# Patient Record
Sex: Female | Born: 1990 | Race: Black or African American | Hispanic: No | Smoking: Former smoker
Health system: Southern US, Community
[De-identification: ages and names within clinical notes are randomized; demographics above are authoritative.]

## PROBLEM LIST (undated history)

## (undated) DIAGNOSIS — R309 Painful micturition, unspecified: Secondary | ICD-10-CM

## (undated) DIAGNOSIS — N921 Excessive and frequent menstruation with irregular cycle: Secondary | ICD-10-CM

## (undated) DIAGNOSIS — D573 Sickle-cell trait: Secondary | ICD-10-CM

## (undated) DIAGNOSIS — R87810 Cervical high risk human papillomavirus (HPV) DNA test positive: Secondary | ICD-10-CM

## (undated) DIAGNOSIS — Z8619 Personal history of other infectious and parasitic diseases: Principal | ICD-10-CM

## (undated) DIAGNOSIS — R0781 Pleurodynia: Secondary | ICD-10-CM

## (undated) DIAGNOSIS — R109 Unspecified abdominal pain: Secondary | ICD-10-CM

## (undated) DIAGNOSIS — Z309 Encounter for contraceptive management, unspecified: Secondary | ICD-10-CM

## (undated) DIAGNOSIS — J45909 Unspecified asthma, uncomplicated: Secondary | ICD-10-CM

## (undated) DIAGNOSIS — Z789 Other specified health status: Secondary | ICD-10-CM

## (undated) DIAGNOSIS — R350 Frequency of micturition: Secondary | ICD-10-CM

## (undated) DIAGNOSIS — A749 Chlamydial infection, unspecified: Secondary | ICD-10-CM

## (undated) DIAGNOSIS — R05 Cough: Secondary | ICD-10-CM

## (undated) HISTORY — DX: Encounter for contraceptive management, unspecified: Z30.9

## (undated) HISTORY — DX: Pleurodynia: R07.81

## (undated) HISTORY — DX: Chlamydial infection, unspecified: A74.9

## (undated) HISTORY — DX: Cervical high risk human papillomavirus (HPV) DNA test positive: R87.810

## (undated) HISTORY — DX: Sickle-cell trait: D57.3

## (undated) HISTORY — DX: Painful micturition, unspecified: R30.9

## (undated) HISTORY — DX: Personal history of other infectious and parasitic diseases: Z86.19

## (undated) HISTORY — DX: Frequency of micturition: R35.0

## (undated) HISTORY — DX: Excessive and frequent menstruation with irregular cycle: N92.1

## (undated) HISTORY — DX: Unspecified abdominal pain: R10.9

## (undated) HISTORY — DX: Cough: R05

## (undated) HISTORY — PX: NO PAST SURGERIES: SHX2092

---
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
## 2012-04-12 ENCOUNTER — Encounter: Payer: Self-pay | Admitting: *Deleted

## 2012-04-12 DIAGNOSIS — D573 Sickle-cell trait: Secondary | ICD-10-CM | POA: Insufficient documentation

## 2012-04-13 ENCOUNTER — Encounter: Payer: Self-pay | Admitting: Adult Health

## 2012-04-13 ENCOUNTER — Ambulatory Visit (INDEPENDENT_AMBULATORY_CARE_PROVIDER_SITE_OTHER): Payer: Medicaid Other | Admitting: Adult Health

## 2012-04-13 VITALS — BP 100/80 | Ht 63.0 in | Wt 105.0 lb

## 2012-04-13 DIAGNOSIS — Z309 Encounter for contraceptive management, unspecified: Secondary | ICD-10-CM

## 2012-04-13 DIAGNOSIS — IMO0001 Reserved for inherently not codable concepts without codable children: Secondary | ICD-10-CM

## 2012-04-13 DIAGNOSIS — Z3202 Encounter for pregnancy test, result negative: Secondary | ICD-10-CM

## 2012-04-13 DIAGNOSIS — Z30017 Encounter for initial prescription of implantable subdermal contraceptive: Secondary | ICD-10-CM

## 2012-04-13 HISTORY — DX: Encounter for contraceptive management, unspecified: Z30.9

## 2012-04-13 MED ORDER — ETONOGESTREL 68 MG ~~LOC~~ IMPL: 68.0000 mg | DRUG_IMPLANT | Freq: Once | SUBCUTANEOUS | Status: DC

## 2012-04-13 NOTE — Progress Notes (Signed)
Subjective:     Patient ID: Deanna Sheppard, female   DOB: Jul 06, 1990, 22 y.o.   MRN: [id number]  HPI Deanna Sheppard is a 22 year old black female in for a nexplanon insertion.LMP 04/03/12, last sex about 1 week before last menses and she was taking lo loestrin through yesterday.   Review of Systems No compalints     Objective:   Physical Exam Blood pressure 100/80, height 5\' 3"  (1.6 m), weight 105 lb (47.628 kg), last menstrual period 04/03/2012, not currently breastfeeding.   Left arm cleansed with betadine, and injected with 1.5 cc 2% lidocaine and waited til numb. Nexplanon easily inserted and steri strips applied. Pressure dressing applied. Exp:06/2014, LOT: [id number]. The rod was easily palpated my pt. and myself. Assessment:      Contraceptive Management Nexplanon insertion    Plan:       Use condoms x 2-4 weeks, keep clean and dry x 24 hours, no heavy lifting, keep steri strips on x 72 hours, Keep pressure dressing on x 24 hours. Follow up prn problems. Pt. Is aware the nexplanon is effective for 3 years, but does not prevent STIs. and that she should use condoms.    Return in 1 year for pap and physical.

## 2012-04-13 NOTE — Patient Instructions (Addendum)
Use condoms x 2-4 weeks, keep clean and dry x 24 hours, no heavy lifting, keep steri strips on x 72 hours, Keep pressure dressing on x 24 hours. Follow up prn problems.  Return to clinic in 1 year for pap and physical. Sign  Up for my chart

## 2012-05-27 ENCOUNTER — Encounter (HOSPITAL_COMMUNITY): Payer: Self-pay | Admitting: *Deleted

## 2012-05-27 ENCOUNTER — Emergency Department (HOSPITAL_COMMUNITY)
Admission: EM | Admit: 2012-05-27 | Discharge: 2012-05-28 | Disposition: A | Discharge status: Home / Self Care | Payer: No Typology Code available for payment source | Attending: Emergency Medicine | Admitting: Emergency Medicine

## 2012-05-27 DIAGNOSIS — IMO0002 Reserved for concepts with insufficient information to code with codable children: Secondary | ICD-10-CM | POA: Insufficient documentation

## 2012-05-27 DIAGNOSIS — S93502A Unspecified sprain of left great toe, initial encounter: Secondary | ICD-10-CM

## 2012-05-27 DIAGNOSIS — S93609A Unspecified sprain of unspecified foot, initial encounter: Secondary | ICD-10-CM | POA: Insufficient documentation

## 2012-05-27 DIAGNOSIS — Y9389 Activity, other specified: Secondary | ICD-10-CM | POA: Insufficient documentation

## 2012-05-27 DIAGNOSIS — Y92410 Unspecified street and highway as the place of occurrence of the external cause: Secondary | ICD-10-CM | POA: Insufficient documentation

## 2012-05-27 DIAGNOSIS — Z862 Personal history of diseases of the blood and blood-forming organs and certain disorders involving the immune mechanism: Secondary | ICD-10-CM | POA: Insufficient documentation
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
## 2012-05-29 ENCOUNTER — Emergency Department (HOSPITAL_COMMUNITY): Payer: No Typology Code available for payment source

## 2012-05-29 ENCOUNTER — Encounter (HOSPITAL_COMMUNITY): Payer: Self-pay

## 2012-05-29 ENCOUNTER — Emergency Department (HOSPITAL_COMMUNITY)
Admission: EM | Admit: 2012-05-29 | Discharge: 2012-05-29 | Disposition: A | Discharge status: Home / Self Care | Payer: No Typology Code available for payment source | Attending: Emergency Medicine | Admitting: Emergency Medicine

## 2012-05-29 DIAGNOSIS — Z862 Personal history of diseases of the blood and blood-forming organs and certain disorders involving the immune mechanism: Secondary | ICD-10-CM | POA: Insufficient documentation

## 2012-05-29 DIAGNOSIS — IMO0002 Reserved for concepts with insufficient information to code with codable children: Secondary | ICD-10-CM | POA: Insufficient documentation

## 2012-05-29 DIAGNOSIS — S6990XA Unspecified injury of unspecified wrist, hand and finger(s), initial encounter: Secondary | ICD-10-CM | POA: Insufficient documentation

## 2012-05-29 DIAGNOSIS — S59909A Unspecified injury of unspecified elbow, initial encounter: Secondary | ICD-10-CM | POA: Insufficient documentation

## 2012-05-29 DIAGNOSIS — S59919A Unspecified injury of unspecified forearm, initial encounter: Secondary | ICD-10-CM | POA: Insufficient documentation

## 2012-05-29 DIAGNOSIS — Z3202 Encounter for pregnancy test, result negative: Secondary | ICD-10-CM | POA: Insufficient documentation

## 2012-05-29 DIAGNOSIS — M545 Low back pain: Secondary | ICD-10-CM

## 2012-05-29 DIAGNOSIS — Y9389 Activity, other specified: Secondary | ICD-10-CM | POA: Insufficient documentation

## 2012-05-29 DIAGNOSIS — S139XXA Sprain of joints and ligaments of unspecified parts of neck, initial encounter: Secondary | ICD-10-CM | POA: Insufficient documentation

## 2012-05-29 DIAGNOSIS — S161XXD Strain of muscle, fascia and tendon at neck level, subsequent encounter: Secondary | ICD-10-CM

## 2012-05-29 DIAGNOSIS — S99919A Unspecified injury of unspecified ankle, initial encounter: Secondary | ICD-10-CM | POA: Insufficient documentation

## 2012-05-29 DIAGNOSIS — S8990XA Unspecified injury of unspecified lower leg, initial encounter: Secondary | ICD-10-CM | POA: Insufficient documentation

## 2012-05-29 DIAGNOSIS — Y92410 Unspecified street and highway as the place of occurrence of the external cause: Secondary | ICD-10-CM | POA: Insufficient documentation

## 2012-05-29 LAB — POCT PREGNANCY, URINE: Preg Test, Ur: NEGATIVE

## 2012-05-29 MED ORDER — TRAMADOL HCL 50 MG PO TABS: 50.0000 mg | ORAL_TABLET | Freq: Four times a day (QID) | ORAL | Status: DC | PRN

## 2012-05-29 MED ORDER — CYCLOBENZAPRINE HCL 10 MG PO TABS: 10.0000 mg | ORAL_TABLET | Freq: Three times a day (TID) | ORAL | Status: DC | PRN
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
## 2012-05-29 NOTE — ED Notes (Signed)
C-collar applied

## 2012-05-30 NOTE — ED Provider Notes (Signed)
Medical screening examination/treatment/procedure(s) were performed by non-physician practitioner and as supervising physician I was immediately available for consultation/collaboration.   Loren Racer, MD 05/30/12 [phone]

## 2012-06-07 ENCOUNTER — Emergency Department (HOSPITAL_COMMUNITY)
Admission: EM | Admit: 2012-06-07 | Discharge: 2012-06-07 | Disposition: A | Discharge status: Home / Self Care | Payer: Medicaid Other | Attending: Emergency Medicine | Admitting: Emergency Medicine

## 2012-06-07 ENCOUNTER — Encounter (HOSPITAL_COMMUNITY): Payer: Self-pay | Admitting: Emergency Medicine

## 2012-06-07 DIAGNOSIS — Z862 Personal history of diseases of the blood and blood-forming organs and certain disorders involving the immune mechanism: Secondary | ICD-10-CM | POA: Insufficient documentation

## 2012-06-07 DIAGNOSIS — T63461A Toxic effect of venom of wasps, accidental (unintentional), initial encounter: Secondary | ICD-10-CM | POA: Insufficient documentation

## 2012-06-07 DIAGNOSIS — Y929 Unspecified place or not applicable: Secondary | ICD-10-CM | POA: Insufficient documentation

## 2012-06-07 DIAGNOSIS — T6391XA Toxic effect of contact with unspecified venomous animal, accidental (unintentional), initial encounter: Secondary | ICD-10-CM | POA: Insufficient documentation

## 2012-06-07 DIAGNOSIS — Z309 Encounter for contraceptive management, unspecified: Secondary | ICD-10-CM | POA: Insufficient documentation

## 2012-06-07 DIAGNOSIS — Y939 Activity, unspecified: Secondary | ICD-10-CM | POA: Insufficient documentation

## 2012-06-07 MED ORDER — DIPHENHYDRAMINE HCL 25 MG PO CAPS: 25.0000 mg | ORAL_CAPSULE | Freq: Four times a day (QID) | ORAL | Status: DC | PRN

## 2012-06-07 MED ORDER — CEPHALEXIN 500 MG PO CAPS: 500.0000 mg | ORAL_CAPSULE | Freq: Four times a day (QID) | ORAL | Status: DC

## 2012-06-07 MED ORDER — CEPHALEXIN 500 MG PO CAPS
500.0000 mg | ORAL_CAPSULE | Freq: Once | ORAL | Status: AC
  Administered 2012-06-07: 500 mg via ORAL
  Filled 2012-06-07: qty 1

## 2012-06-07 MED ORDER — IBUPROFEN 600 MG PO TABS: 600.0000 mg | ORAL_TABLET | Freq: Four times a day (QID) | ORAL | Status: DC | PRN

## 2012-06-07 MED ORDER — DIPHENHYDRAMINE HCL 25 MG PO CAPS
25.0000 mg | ORAL_CAPSULE | Freq: Once | ORAL | Status: AC
  Administered 2012-06-07: 25 mg via ORAL
  Filled 2012-06-07: qty 1

## 2012-06-07 MED ORDER — IBUPROFEN 400 MG PO TABS
400.0000 mg | ORAL_TABLET | Freq: Once | ORAL | Status: AC
  Administered 2012-06-07: 400 mg via ORAL
  Filled 2012-06-07: qty 1

## 2012-06-07 NOTE — ED Notes (Signed)
Pt c/o pain and swelling to bee sting on right 2nd toe.

## 2012-06-08 NOTE — ED Provider Notes (Signed)
History     CSN: [id number]  Arrival date & time 06/07/12  1451   First MD Initiated Contact with Patient 06/07/12 1458      Chief Complaint  Patient presents with  . Insect Bite    (Consider location/radiation/quality/duration/timing/severity/associated sxs/prior treatment) HPI Comments: Deanna Sheppard is a 22 y.o. Female presenting with a yellow jacket sting to her right 2nd toe which occurred yesterday.  She had pain and swelling at the site initially,  But has developed worse pain,swelling and drainage today at the site of the puncture wound. Her grandmother "dug around" in it yesterday looking for a possible retained stinger, which was not found.  Pain is constant, throbbing and does not radiate, is worse with palpation and improved with elevation.  She has taken tylenol without relief.      The history is provided by the patient.    Past Medical History  Diagnosis Date  . No pertinent past medical history   . Sickle cell trait   . Contraception management 04/13/2012    Past Surgical History  Procedure Laterality Date  . No past surgeries      Family History  Problem Relation Age of Onset  . Adopted: Yes  . Hypertension Mother   . Cancer Maternal Grandmother     breast   . Diabetes Maternal Grandmother     History  Substance Use Topics  . Smoking status: Never Smoker   . Smokeless tobacco: Never Used  . Alcohol Use: No    OB History   Grav Para Term Preterm Abortions TAB SAB Ect Mult Living   1 1 1  0 0 0 0 0 0 1      Review of Systems  Constitutional: Negative for fever and chills.  HENT: Negative for facial swelling.   Respiratory: Negative for shortness of breath and wheezing.   Skin: Positive for wound.  Neurological: Negative for numbness.    Allergies  Review of patient's allergies indicates no known allergies.  Home Medications   Current Outpatient Rx  Name  Route  Sig  Dispense  Refill  . acetaminophen (TYLENOL) 500 MG tablet  Oral   Take 500-1,000 mg by mouth every 6 (six) hours as needed for pain.         . cephALEXin (KEFLEX) 500 MG capsule   Oral   Take 1 capsule (500 mg total) by mouth 4 (four) times daily.   28 capsule   0   . cyclobenzaprine (FLEXERIL) 10 MG tablet   Oral   Take 1 tablet (10 mg total) by mouth 3 (three) times daily as needed.   21 tablet   0   . diphenhydrAMINE (BENADRYL) 25 mg capsule   Oral   Take 1 capsule (25 mg total) by mouth every 6 (six) hours as needed for itching.   30 capsule   0   . ibuprofen (ADVIL,MOTRIN) 600 MG tablet   Oral   Take 1 tablet (600 mg total) by mouth every 6 (six) hours as needed for pain.   20 tablet   0   . traMADol (ULTRAM) 50 MG tablet   Oral   Take 1 tablet (50 mg total) by mouth every 6 (six) hours as needed for pain.   20 tablet   0     BP 116/63  Pulse 91  Temp(Src) 98.4 F (36.9 C) (Oral)  Resp 17  Ht 5\' 3"  (1.6 m)  Wt 110 lb (49.896 kg)  BMI 19.49 kg/m2  SpO2 99%  LMP 05/04/2012  Physical Exam  Constitutional: She appears well-developed and well-nourished. No distress.  HENT:  Head: Normocephalic.  Neck: Neck supple.  Cardiovascular: Normal rate.   Pulmonary/Chest: Effort normal. She has no wheezes.  Musculoskeletal: Normal range of motion. She exhibits no edema.  Skin: There is erythema.  Edema with erythema of right 2nd distal toe.  Cap refill less than 3 sec.  No swelling of foot,  There is a small puncture at tip of toe with serous drainage.  No fluctuance.    ED Course  Procedures (including critical care time)  Labs Reviewed - No data to display No results found.   1. Yellow jacket sting, initial encounter       MDM  Yellow jacket sting with probable localized reaction. Benadryl,  Ibuprofen recommended.  Elevation.  Pt placed on keflex given manipulation with tweezers ytd - concern for skin infection. Encouraged warm soaks,  Recheck if sx worsen.        Burgess Amor, PA-C 06/08/12 2128

## 2012-06-10 NOTE — ED Provider Notes (Signed)
Medical screening examination/treatment/procedure(s) were performed by non-physician practitioner and as supervising physician I was immediately available for consultation/collaboration.  Donnetta Hutching, MD 06/10/12 [phone]

## 2012-09-27 ENCOUNTER — Encounter: Payer: Self-pay | Admitting: *Deleted

## 2012-09-27 ENCOUNTER — Ambulatory Visit: Payer: Medicaid Other | Admitting: Adult Health

## 2012-10-17 ENCOUNTER — Encounter: Payer: Self-pay | Admitting: Adult Health

## 2012-10-17 ENCOUNTER — Ambulatory Visit (INDEPENDENT_AMBULATORY_CARE_PROVIDER_SITE_OTHER): Payer: Medicaid Other | Admitting: Adult Health

## 2012-10-17 VITALS — BP 86/50 | Ht 63.0 in | Wt 114.0 lb

## 2012-10-17 DIAGNOSIS — Z3202 Encounter for pregnancy test, result negative: Secondary | ICD-10-CM

## 2012-10-17 DIAGNOSIS — R350 Frequency of micturition: Secondary | ICD-10-CM

## 2012-10-17 HISTORY — DX: Frequency of micturition: R35.0

## 2012-10-17 LAB — POCT URINALYSIS DIPSTICK
Leukocytes, UA: NEGATIVE
Nitrite, UA: NEGATIVE
Protein, UA: NEGATIVE

## 2012-10-17 NOTE — Progress Notes (Signed)
Subjective:     Patient ID: Deanna Sheppard, female   DOB: [birth year], 22 y.o.   MRN: [id number]  HPI Deanna Sheppard is a 22 year old black female in complaining of urinary frequency. No loss of urine,no pain,occasion LLQ cramp and spots some with nexplanon. Last sex 3 weeks ago.She has stopped sodas but drinks a lot of kool aide.No history of gestational diabetes, no increase in thirst or weight loss.  Review of Systems See HPI. Reviewed past medical,surgical, social and family history. Reviewed medications and allergies.     Objective:   Physical Exam BP 86/50  Ht 5\' 3"  (1.6 m)  Wt 114 lb (51.71 kg)  BMI 20.2 kg/m2  LMP 09/18/2012  Breastfeeding? Nourine pregnancy test negative and urine dipstick negative, Skin warm and dry.Pelvic: external genitalia is normal in appearance, vagina: normal in appearance, cervix:smooth and bulbous, uterus: normal size, shape and contour, non tender, no masses felt, adnexa: no masses or tenderness noted.     Assessment:     Urinary frequency    Plan:     Stop kool aide and drink water, if symptoms persist call back Review handout on urinary frequency

## 2012-10-17 NOTE — Patient Instructions (Signed)
Urinary Frequency The number of times a normal person urinates depends upon how much liquid they take in and how much liquid they are losing. If the temperature is hot and there is high humidity then the person will sweat more and usually breathe a little more frequently. These factors decrease the amount of frequency of urination that would be considered normal. The amount you drink is easily determined, but the amount of fluid lost is sometimes more difficult to calculate.  Fluid is lost in two ways:  Sensible fluid loss is usually measured by the amount of urine that you get rid of. Losses of fluid can also occur with diarrhea.  Insensible fluid loss is more difficult to measure. It is caused by evaporation. Insensible loss of fluid occurs through breathing and sweating. It usually ranges from a little less than a quart to a little more than a quart of fluid a day. In normal temperatures and activity levels the average person may urinate 4 to 7 times in a 24-hour period. Needing to urinate more often than that could indicate a problem. If one urinates 4 to 7 times in 24 hours and has large volumes each time, that could indicate a different problem from one who urinates 4 to 7 times a day and has small volumes. The time of urinating is also an important. Most urinating should be done during the waking hours. Getting up at night to urinate frequently can indicate some problems. CAUSES  The bladder is the organ in your lower abdomen that holds urine. Like a balloon, it swells some as it fills up. Your nerves sense this and tell you it is time to head for the bathroom. There are a number of reasons that you might feel the need to urinate more often than usual. They include:  Urinary tract infection. This is usually associated with other signs such as burning when you urinate.  In men, problems with the prostate (a walnut-size gland that is located near the tube that carries urine out of your body).  There are two reasons why the prostate can cause an increased frequency of urination:  An enlarged prostate that does not let the bladder empty well. If the bladder only half empties when you urinate then it only has half the capacity to fill before you have to urinate again.  The nerves in the bladder become more hypersensitive with an increased size of the prostate even if the bladder empties completely.  Pregnancy.  Obesity. Excess weight is more likely to cause a problem for women more than for men.  Bladder stones or other bladder problems.  Caffeine.  Alcohol.  Medications. For example, drugs that help the body get rid of extra fluid (diuretics) increase urine production. Some other medicines must be taken with lots of fluids.  Muscle or nerve weakness. This might be the result of a spinal cord injury, a stroke, multiple sclerosis or Parkinson's disease.  Long-standing diabetes can decrease the sensation of the bladder. This loss of sensation makes it harder to sense the bladder needs to be emptied. Over a period of years the bladder is stretched out by constant overfilling. This weakens the bladder muscles so that the bladder does not empty well and has less capacity to fill with new urine.  Interstitial cystitis (also called painful bladder syndrome). This condition develops because the tissues that line the insider of the bladder are inflamed (inflammation is the body's way of reacting to injury or infection). It causes pain  and frequent urination. It occurs in women more often than in men. DIAGNOSIS   To decide what might be causing your urinary frequency, your healthcare provider will probably:  Ask about symptoms you have noticed.  Ask about your overall health. This will include questions about any medications you are taking.  Do a physical examination.  Order some tests. These might include:  A blood test to check for diabetes or other health issues that could be  contributing to the problem.  Urine testing. This could measure the flow of urine and the pressure on the bladder.  A test of your neurological system (the brain, spinal cord and nerves). This is the system that senses the need to urinate.  A bladder test to check whether it is emptying completely when you urinate.  Cytoscopy. This test uses a thin tube with a tiny camera on it. It offers a look inside your urethra and bladder to see if there are problems.  Imaging tests. You might be given a contrast dye and then asked to urinate. X-rays are taken to see how your bladder is working. TREATMENT  It is important for you to be evaluated to determine if the amount or frequency that you have is unusual or abnormal. If it is found to be abnormal the cause should be determined and this can usually be found out easily. Depending upon the cause treatment could include medication, stimulation of the nerves, or surgery. There are not too many things that you can do as an individual to change your urinary frequency. It is important that you balance the amount of fluid intake needed to compensate for your activity and the temperature. Medical problems will be diagnosed and taken care of by your physician. There is no particular bladder training such as Kegel's exercises that you can do to help urinary frequency. This is an exercise this is usually done for people who have leaking of urine when they laugh cough or sneeze. HOME CARE INSTRUCTIONS   Take any medications your healthcare provider prescribed or suggested. Follow the directions carefully.  Practice any lifestyle changes that are recommended. These might include:  Drinking less fluid or drinking at different times of the day. If you need to urinate often during the night, for example, you may need to stop drinking fluids early in the evening.  Cutting down on caffeine or alcohol. They both can make you need to urinate more often than normal.  Caffeine is found in coffee, tea and sodas.  Losing weight, if that is recommended.  Keep a journal or a log. You might be asked to record how much you drink and when and when you feel the need to urinate. This will also help evaluate how well the treatment provided by your physician is working. SEEK MEDICAL CARE IF:   Your need to urinate often gets worse.  You feel increased pain or irritation when you urinate.  You notice blood in your urine.  You have questions about any medications that your healthcare provider recommended.  You notice blood, pus or swelling at the site of any test or treatment procedure.  You develop a fever of more than 100.5 F (38.1 C). SEEK IMMEDIATE MEDICAL CARE IF:  You develop a fever of more than 102.0 F (38.9 C). Document Released: 10/17/2008 Document Revised: 03/15/2011 Document Reviewed: 10/17/2008 [facility] Patient Information 2014 [place], [place]. Stop kool aide drink water

## 2012-11-10 ENCOUNTER — Encounter (HOSPITAL_COMMUNITY): Payer: Self-pay | Admitting: Emergency Medicine

## 2012-11-10 ENCOUNTER — Emergency Department (HOSPITAL_COMMUNITY)
Admission: EM | Admit: 2012-11-10 | Discharge: 2012-11-10 | Discharge status: Against Medical Advice | Payer: Medicaid Other | Attending: Emergency Medicine | Admitting: Emergency Medicine

## 2012-11-10 DIAGNOSIS — R1110 Vomiting, unspecified: Secondary | ICD-10-CM | POA: Insufficient documentation

## 2012-11-10 NOTE — ED Notes (Signed)
Vomiting, fever, no diarrhea, feels weak.Pain rt side body from mid abd to rt shoulder.

## 2012-11-10 NOTE — ED Notes (Signed)
Pt not found in waiting area or outside. 

## 2012-12-21 ENCOUNTER — Encounter: Payer: Self-pay | Admitting: Adult Health

## 2012-12-21 ENCOUNTER — Ambulatory Visit (INDEPENDENT_AMBULATORY_CARE_PROVIDER_SITE_OTHER): Payer: Medicaid Other | Admitting: Adult Health

## 2012-12-21 VITALS — BP 94/60 | Ht 63.0 in | Wt 114.0 lb

## 2012-12-21 DIAGNOSIS — Z309 Encounter for contraceptive management, unspecified: Secondary | ICD-10-CM

## 2012-12-21 DIAGNOSIS — Z3202 Encounter for pregnancy test, result negative: Secondary | ICD-10-CM

## 2012-12-21 DIAGNOSIS — Z3046 Encounter for surveillance of implantable subdermal contraceptive: Secondary | ICD-10-CM

## 2012-12-21 MED ORDER — MEDROXYPROGESTERONE ACETATE 150 MG/ML IM SUSP: 150.0000 mg | INTRAMUSCULAR | Status: DC

## 2012-12-21 MED ORDER — MEDROXYPROGESTERONE ACETATE 150 MG/ML IM SUSP
150.0000 mg | Freq: Once | INTRAMUSCULAR | Status: AC
  Administered 2012-12-21: 150 mg via INTRAMUSCULAR

## 2012-12-21 NOTE — Progress Notes (Signed)
Subjective:     Patient ID: Mena Goes, female   DOB: 12/07/1990, 22 y.o.   MRN: [id number]  HPI Frimet is a 22 year old black female in for nexplanon removal secondary to bleeding and wants depo.  Review of Systems See HPI Reviewed past medical,surgical, social and family history. Reviewed medications and allergies.     Objective:   Physical Exam BP 94/60  Ht 5\' 3"  (1.6 m)  Wt 114 lb (51.71 kg)  BMI 20.20 kg/m2  LMP 12/19/2012   UPT negative, verbal consent obtained, Left arm cleansed with betadine, and injected with 1.5 cc 2% lidocaine and waited til numb.Under sterile technique a #11 blade was used to make small vertical incision, and a curved forceps was used to easily remove rod. Steri strips applied. Pressure dressing applied.Pt to get depo today.  Assessment:     Nexplanon removal Contraceptive management    Plan:     Use condoms, keep clean and dry x 24 hours, no heavy lifting, keep steri strips on x 72 hours, Keep pressure dressing on x 24 hours. Follow up prn problems.   Return in 12 weeks for next depo

## 2012-12-21 NOTE — Assessment & Plan Note (Signed)
nexplanon removed 12/21/12 wants depo

## 2012-12-21 NOTE — Patient Instructions (Addendum)
Use condoms Medroxyprogesterone injection [Contraceptive] What is this medicine? MEDROXYPROGESTERONE (me DROX ee proe JES te rone) contraceptive injections prevent pregnancy. They provide effective birth control for 3 months. Depo-subQ Provera 104 is also used for treating pain related to endometriosis. This medicine may be used for other purposes; ask your health care provider or pharmacist if you have questions. COMMON BRAND NAME(S): Depo-Provera, Depo-subQ Provera 104 What should I tell my health care provider before I take this medicine? They need to know if you have any of these conditions: -frequently drink alcohol -asthma -blood vessel disease or a history of a blood clot in the lungs or legs -bone disease such as osteoporosis -breast cancer -diabetes -eating disorder (anorexia nervosa or bulimia) -high blood pressure -HIV infection or AIDS -kidney disease -liver disease -mental depression -migraine -seizures (convulsions) -stroke -tobacco smoker -vaginal bleeding -an unusual or allergic reaction to medroxyprogesterone, other hormones, medicines, foods, dyes, or preservatives -pregnant or trying to get pregnant -breast-feeding How should I use this medicine? Depo-Provera Contraceptive injection is given into a muscle. Depo-subQ Provera 104 injection is given under the skin. These injections are given by a health care professional. You must not be pregnant before getting an injection. The injection is usually given during the first 5 days after the start of a menstrual period or 6 weeks after delivery of a baby. Talk to your pediatrician regarding the use of this medicine in children. Special care may be needed. These injections have been used in female children who have started having menstrual periods. Overdosage: If you think you have taken too much of this medicine contact a poison control center or emergency room at once. NOTE: This medicine is only for you. Do not share  this medicine with others. What if I miss a dose? Try not to miss a dose. You must get an injection once every 3 months to maintain birth control. If you cannot keep an appointment, call and reschedule it. If you wait longer than 13 weeks between Depo-Provera contraceptive injections or longer than 14 weeks between Depo-subQ Provera 104 injections, you could get pregnant. Use another method for birth control if you miss your appointment. You may also need a pregnancy test before receiving another injection. What may interact with this medicine? Do not take this medicine with any of the following medications: -bosentan This medicine may also interact with the following medications: -aminoglutethimide -antibiotics or medicines for infections, especially rifampin, rifabutin, rifapentine, and griseofulvin -aprepitant -barbiturate medicines such as phenobarbital or primidone -bexarotene -carbamazepine -medicines for seizures like ethotoin, felbamate, oxcarbazepine, phenytoin, topiramate -modafinil -St. John's wort This list may not describe all possible interactions. Give your health care provider a list of all the medicines, herbs, non-prescription drugs, or dietary supplements you use. Also tell them if you smoke, drink alcohol, or use illegal drugs. Some items may interact with your medicine. What should I watch for while using this medicine? This drug does not protect you against HIV infection (AIDS) or other sexually transmitted diseases. Use of this product may cause you to lose calcium from your bones. Loss of calcium may cause weak bones (osteoporosis). Only use this product for more than 2 years if other forms of birth control are not right for you. The longer you use this product for birth control the more likely you will be at risk for weak bones. Ask your health care professional how you can keep strong bones. You may have a change in bleeding pattern or irregular periods. Many females  stop having periods while taking this drug. If you have received your injections on time, your chance of being pregnant is very low. If you think you may be pregnant, see your health care professional as soon as possible. Tell your health care professional if you want to get pregnant within the next year. The effect of this medicine may last a long time after you get your last injection. What side effects may I notice from receiving this medicine? Side effects that you should report to your doctor or health care professional as soon as possible: -allergic reactions like skin rash, itching or hives, swelling of the face, lips, or tongue -breast tenderness or discharge -breathing problems -changes in vision -depression -feeling faint or lightheaded, falls -fever -pain in the abdomen, chest, groin, or leg -problems with balance, talking, walking -unusually weak or tired -yellowing of the eyes or skin Side effects that usually do not require medical attention (report to your doctor or health care professional if they continue or are bothersome): -acne -fluid retention and swelling -headache -irregular periods, spotting, or absent periods -temporary pain, itching, or skin reaction at site where injected -weight gain This list may not describe all possible side effects. Call your doctor for medical advice about side effects. You may report side effects to FDA at 1-800-FDA-1088. Where should I keep my medicine? This does not apply. The injection will be given to you by a health care professional. NOTE: This sheet is a summary. It may not cover all possible information. If you have questions about this medicine, talk to your doctor, pharmacist, or health care provider.  2014, Elsevier/Gold Standard. (2008-01-12 18:37:56) , keep clean and dry x 24 hours, no heavy lifting, keep steri strips on x 72 hours, Keep pressure dressing on x 24 hours. Follow up prn problems. Get depo in 12 weeks

## 2013-01-26 ENCOUNTER — Telehealth: Payer: Self-pay | Admitting: Adult Health

## 2013-01-26 NOTE — Telephone Encounter (Signed)
Spoke with pt. Pt is on Depo now. Was on Implanon. Got 1st Depo shot in Dec. 2014. Pt states she is bleeding everyday, some days are heavier than others. I spoke with Anderson Malta. She recommends to schedule an appt to be checked out. Call transferred to front desk to schedule an appt. [place]

## 2013-01-31 ENCOUNTER — Ambulatory Visit: Payer: Medicaid Other | Admitting: Adult Health

## 2013-01-31 ENCOUNTER — Encounter (INDEPENDENT_AMBULATORY_CARE_PROVIDER_SITE_OTHER): Payer: Self-pay

## 2013-03-15 ENCOUNTER — Ambulatory Visit: Payer: Medicaid Other

## 2013-03-19 ENCOUNTER — Ambulatory Visit: Payer: Medicaid Other

## 2013-05-02 ENCOUNTER — Ambulatory Visit (INDEPENDENT_AMBULATORY_CARE_PROVIDER_SITE_OTHER): Payer: Medicaid Other | Admitting: Adult Health

## 2013-05-02 ENCOUNTER — Encounter: Payer: Self-pay | Admitting: Adult Health

## 2013-05-02 ENCOUNTER — Other Ambulatory Visit (HOSPITAL_COMMUNITY)
Admission: RE | Admit: 2013-05-02 | Discharge: 2013-05-02 | Disposition: A | Discharge status: Home / Self Care | Payer: Medicaid Other | Source: Ambulatory Visit | Attending: Adult Health | Admitting: Adult Health

## 2013-05-02 VITALS — BP 98/58 | HR 76 | Ht 63.0 in | Wt 117.0 lb

## 2013-05-02 DIAGNOSIS — R87810 Cervical high risk human papillomavirus (HPV) DNA test positive: Secondary | ICD-10-CM | POA: Insufficient documentation

## 2013-05-02 DIAGNOSIS — Z01419 Encounter for gynecological examination (general) (routine) without abnormal findings: Secondary | ICD-10-CM

## 2013-05-02 DIAGNOSIS — Z1151 Encounter for screening for human papillomavirus (HPV): Secondary | ICD-10-CM | POA: Insufficient documentation

## 2013-05-02 DIAGNOSIS — Z0000 Encounter for general adult medical examination without abnormal findings: Secondary | ICD-10-CM

## 2013-05-02 DIAGNOSIS — Z124 Encounter for screening for malignant neoplasm of cervix: Secondary | ICD-10-CM | POA: Insufficient documentation

## 2013-05-02 DIAGNOSIS — Z113 Encounter for screening for infections with a predominantly sexual mode of transmission: Secondary | ICD-10-CM

## 2013-05-02 LAB — RPR

## 2013-05-02 NOTE — Patient Instructions (Signed)
Physical in 1 year Depo as scheduled Sexually Transmitted Disease A sexually transmitted disease (STD) is a disease or infection that may be passed (transmitted) from person to person, usually during sexual activity. This may happen by way of saliva, semen, blood, vaginal mucus, or urine. Common STDs include:   Gonorrhea.   Chlamydia.   Syphilis.   HIV and AIDS.   Genital herpes.   Hepatitis B and C.   Trichomonas.   Human papillomavirus (HPV).   Pubic lice.   Scabies.  Mites.  Bacterial vaginosis. WHAT ARE CAUSES OF STDs? An STD may be caused by bacteria, a virus, or parasites. STDs are often transmitted during sexual activity if one person is infected. However, they may also be transmitted through nonsexual means. STDs may be transmitted after:   Sexual intercourse with an infected person.   Sharing sex toys with an infected person.   Sharing needles with an infected person or using unclean piercing or tattoo needles.  Having intimate contact with the genitals, mouth, or rectal areas of an infected person.   Exposure to infected fluids during birth. WHAT ARE THE SIGNS AND SYMPTOMS OF STDs? Different STDs have different symptoms. Some people may not have any symptoms. If symptoms are present, they may include:   Painful or bloody urination.   Pain in the pelvis, abdomen, vagina, anus, throat, or eyes.   Skin rash, itching, irritation, growths, sores (lesions), ulcerations, or warts in the genital or anal area.  Abnormal vaginal discharge with or without bad odor.   Penile discharge in men.   Fever.   Pain or bleeding during sexual intercourse.   Swollen glands in the groin area.   Yellow skin and eyes (jaundice). This is seen with hepatitis.   Swollen testicles.  Infertility.  Sores and blisters in the mouth. HOW ARE STDs DIAGNOSED? To make a diagnosis, your health care provider may:   Take a medical history.   Perform a  physical exam.   Take a sample of any discharge for examination.  Swab the throat, cervix, opening to the penis, rectum, or vagina for testing.  Test a sample of your first morning urine.   Perform blood tests.   Perform a Pap smear, if this applies.   Perform a colposcopy.   Perform a laparoscopy.  HOW ARE STDs TREATED? Treatment depends on the STD. Some STDs may be treated but not cured.   Chlamydia, gonorrhea, trichomonas, and syphilis can be cured with antibiotics.   Genital herpes, hepatitis, and HIV can be treated, but not cured, with prescribed medicines. The medicines lessen symptoms.   Genital warts from HPV can be treated with medicine or by freezing, burning (electrocautery), or surgery. Warts may come back.   HPV cannot be cured with medicine or surgery. However, abnormal areas may be removed from the cervix, vagina, or vulva.   If your diagnosis is confirmed, your recent sexual partners need treatment. This is true even if they are symptom-free or have a negative culture or evaluation. They should not have sex until their health care providers say it is OK. HOW CAN I REDUCE MY RISK OF GETTING AN STD?  Use latex condoms, dental dams, and water-soluble lubricants during sexual activity. Do not use petroleum jelly or oils.  Get vaccinated for HPV and hepatitis. If you have not received these vaccines in the past, talk to your health care provider about whether one or both might be right for you.   Avoid risky sex practices that  can break the skin.  WHAT SHOULD I DO IF I THINK I HAVE AN STD?  See your health care provider.   Inform all sexual partners. They should be tested and treated for any STDs.  Do not have sex until your health care provider says it is OK. WHEN SHOULD I GET HELP? Seek immediate medical care if:  You develop severe abdominal pain.  You are a man and notice swelling or pain in the testicles.  You are a woman and notice  swelling or pain in your vagina. Document Released: 03/13/2002 Document Revised: 10/11/2012 Document Reviewed: 07/11/2012 [facility] Patient Information 2014 [place], [place].

## 2013-05-02 NOTE — Progress Notes (Signed)
Patient ID: Deanna Sheppard, female   DOB: 17-Jun-1990, 23 y.o.   MRN: [id number] History of Present Illness: Deanna Sheppard is a 23 year old black female in for a pap and physical.She is o depo and periods not regular and she wants STD testing.   Current Medications, Allergies, Past Medical History, Past Surgical History, Family History and Social History were reviewed in [employer] record.     Review of Systems: Patient denies any headaches, blurred vision, shortness of breath, chest pain, abdominal pain, problems with bowel movements, urination, or intercourse.No joint pain or mood swings.     Physical Exam:BP 98/58  Pulse 76  Ht 5\' 3"  (1.6 m)  Wt 117 lb (53.071 kg)  BMI 20.73 kg/m2  LMP 03/18/2013 General:  Well developed, well nourished, no acute distress Skin:  Warm and dry Neck:  Midline trachea, normal thyroid Lungs; Clear to auscultation bilaterally Breast:  No dominant palpable mass, retraction, or nipple discharge Cardiovascular: Regular rate and rhythm Abdomen:  Soft, non tender, no hepatosplenomegaly Pelvic:  External genitalia is normal in appearance.  The vagina is normal in appearance. The cervix is bulbous.Pap performed.  Uterus is felt to be normal size, shape, and contour.  No  adnexal masses or tenderness noted. Extremities:  No swelling or varicosities noted Psych:  No mood changes, alert and cooperative,seems happy   Impression: Yearly gyn exam STD testing    Plan: Physical in 1 year Depo as scheduled Check GC/CHL,HIV,RPR,HSV 2 Review handout on STDs

## 2013-05-03 LAB — GC/CHLAMYDIA PROBE AMP
CT PROBE, AMP APTIMA: POSITIVE — AB
GC PROBE AMP APTIMA: NEGATIVE

## 2013-05-03 LAB — HSV 2 ANTIBODY, IGG: HSV 2 GLYCOPROTEIN G AB, IGG: 0.16 IV

## 2013-05-03 LAB — HIV ANTIBODY (ROUTINE TESTING W REFLEX): HIV: NONREACTIVE

## 2013-05-07 ENCOUNTER — Telehealth: Payer: Self-pay | Admitting: Adult Health

## 2013-05-07 MED ORDER — AZITHROMYCIN 500 MG PO TABS: ORAL_TABLET | ORAL | Status: DC

## 2013-05-07 NOTE — Telephone Encounter (Signed)
Pt aware +chlamydia, will rx azithromycin 500 mg #2 take 2 po now and tell partner to go to clinic, POT 6/1 at 3:30 pm,NO sex,NCCDRC sent

## 2013-05-07 NOTE — Telephone Encounter (Signed)
No voice mail.

## 2013-05-08 ENCOUNTER — Telehealth: Payer: Self-pay | Admitting: *Deleted

## 2013-05-08 NOTE — Telephone Encounter (Signed)
rx at drug store go get now and take

## 2013-05-09 ENCOUNTER — Telehealth: Payer: Self-pay | Admitting: Adult Health

## 2013-05-09 NOTE — Telephone Encounter (Signed)
Pt aware of abnormal pap and need for colpo, will make appt 

## 2013-05-17 ENCOUNTER — Ambulatory Visit (INDEPENDENT_AMBULATORY_CARE_PROVIDER_SITE_OTHER): Payer: Medicaid Other | Admitting: Obstetrics & Gynecology

## 2013-05-17 ENCOUNTER — Ambulatory Visit (INDEPENDENT_AMBULATORY_CARE_PROVIDER_SITE_OTHER): Payer: Medicaid Other | Admitting: Advanced Practice Midwife

## 2013-05-17 ENCOUNTER — Encounter: Payer: Self-pay | Admitting: Advanced Practice Midwife

## 2013-05-17 ENCOUNTER — Encounter: Payer: Self-pay | Admitting: Obstetrics & Gynecology

## 2013-05-17 VITALS — BP 90/60 | Wt 117.0 lb

## 2013-05-17 VITALS — BP 98/60 | Ht 63.0 in | Wt 117.0 lb

## 2013-05-17 DIAGNOSIS — Z309 Encounter for contraceptive management, unspecified: Secondary | ICD-10-CM

## 2013-05-17 DIAGNOSIS — N879 Dysplasia of cervix uteri, unspecified: Secondary | ICD-10-CM

## 2013-05-17 DIAGNOSIS — Z3202 Encounter for pregnancy test, result negative: Secondary | ICD-10-CM

## 2013-05-17 DIAGNOSIS — R87810 Cervical high risk human papillomavirus (HPV) DNA test positive: Secondary | ICD-10-CM

## 2013-05-17 DIAGNOSIS — N870 Mild cervical dysplasia: Secondary | ICD-10-CM

## 2013-05-17 DIAGNOSIS — Z3049 Encounter for surveillance of other contraceptives: Secondary | ICD-10-CM

## 2013-05-17 DIAGNOSIS — B977 Papillomavirus as the cause of diseases classified elsewhere: Secondary | ICD-10-CM

## 2013-05-17 LAB — POCT URINE PREGNANCY: Preg Test, Ur: NEGATIVE

## 2013-05-17 MED ORDER — MEDROXYPROGESTERONE ACETATE 150 MG/ML IM SUSP
150.0000 mg | Freq: Once | INTRAMUSCULAR | Status: AC
  Administered 2013-05-17: 150 mg via INTRAMUSCULAR

## 2013-05-17 MED ORDER — MEDROXYPROGESTERONE ACETATE 150 MG/ML IM SUSP: 150.0000 mg | INTRAMUSCULAR | Status: DC

## 2013-05-17 NOTE — Progress Notes (Signed)
Pt here for Depo shot. No complaints at this time. To return in 12 weeks for next shot. [name] 

## 2013-05-17 NOTE — Progress Notes (Signed)
Patient ID: AKYRA BOUCHIE, female   DOB: [birth year], 23 y.o.   MRN: [id number] Pap performed:  05/02/2013 Result:  ASCUS +HPV  History of abnormal Pap: no  Colposcopy: adequate Acetowhite changes       positive Punctation                      negative Mosaicism                      negative Abnormal Vessels          negative  Biopsy performed:          no  Impression: HPV atypia  Recommendation: Follow up cytology with HPV evaluation in 1 year      Past Medical History  Diagnosis Date  . No pertinent past medical history   . Sickle cell trait   . Contraception management 04/13/2012  . Urinary frequency 10/17/2012    Past Surgical History  Procedure Laterality Date  . No past surgeries      OB History   Grav Para Term Preterm Abortions TAB SAB Ect Mult Living   1 1 1  0 0 0 0 0 0 1      No Known Allergies  History   Social History  . Marital Status: Single    Spouse Name: N/A    Number of Children: N/A  . Years of Education: N/A   Social History Main Topics  . Smoking status: Never Smoker   . Smokeless tobacco: Never Used  . Alcohol Use: No  . Drug Use: No  . Sexual Activity: Not Currently    Birth Control/ Protection: Condom, Injection   Other Topics Concern  . None   Social History Narrative  . None    Family History  Problem Relation Age of Onset  . Adopted: Yes  . Hypertension Mother   . Cancer Maternal Grandmother     breast   . Diabetes Maternal Grandmother

## 2013-06-04 ENCOUNTER — Ambulatory Visit: Payer: Medicaid Other | Admitting: Adult Health

## 2013-06-06 ENCOUNTER — Ambulatory Visit (INDEPENDENT_AMBULATORY_CARE_PROVIDER_SITE_OTHER): Payer: Medicaid Other | Admitting: Adult Health

## 2013-06-06 ENCOUNTER — Encounter: Payer: Self-pay | Admitting: Adult Health

## 2013-06-06 VITALS — BP 100/60 | Ht 64.0 in | Wt 120.0 lb

## 2013-06-06 DIAGNOSIS — Z8619 Personal history of other infectious and parasitic diseases: Secondary | ICD-10-CM

## 2013-06-06 HISTORY — DX: Personal history of other infectious and parasitic diseases: Z86.19

## 2013-06-06 NOTE — Progress Notes (Signed)
Subjective:     Patient ID: Deanna Sheppard, female   DOB: 1990/07/20, 23 y.o.   MRN: [id number]  HPI Raihana is a 23 year old black female in for POT.  Review of Systems See HPI Reviewed past medical,surgical, social and family history. Reviewed medications and allergies.     Objective:   Physical Exam BP 100/60  Ht 5\' 4"  (1.626 m)  Wt 120 lb (54.432 kg)  BMI 20.59 kg/m2  LMP 05/11/2013   talk only took meds,no complaints will check GC/CHL  Assessment:     Proof of treatment recent chlamydia    Plan:     GC/CHL Use condoms Follow up prn

## 2013-06-06 NOTE — Patient Instructions (Signed)
Use condoms follow up prn

## 2013-06-07 LAB — GC/CHLAMYDIA PROBE AMP
CT PROBE, AMP APTIMA: NEGATIVE
GC Probe RNA: NEGATIVE

## 2013-08-09 ENCOUNTER — Ambulatory Visit: Payer: Medicaid Other

## 2013-08-24 ENCOUNTER — Encounter (HOSPITAL_COMMUNITY): Payer: Self-pay | Admitting: Emergency Medicine

## 2013-08-24 ENCOUNTER — Emergency Department (HOSPITAL_COMMUNITY)
Admission: EM | Admit: 2013-08-24 | Discharge: 2013-08-25 | Disposition: A | Discharge status: Home / Self Care | Payer: Medicaid Other | Attending: Emergency Medicine | Admitting: Emergency Medicine

## 2013-08-24 DIAGNOSIS — L509 Urticaria, unspecified: Secondary | ICD-10-CM

## 2013-08-24 DIAGNOSIS — R21 Rash and other nonspecific skin eruption: Secondary | ICD-10-CM | POA: Diagnosis present

## 2013-08-24 DIAGNOSIS — R000 Tachycardia, unspecified: Secondary | ICD-10-CM | POA: Insufficient documentation

## 2013-08-24 DIAGNOSIS — M7989 Other specified soft tissue disorders: Secondary | ICD-10-CM | POA: Diagnosis not present

## 2013-08-24 DIAGNOSIS — Z8619 Personal history of other infectious and parasitic diseases: Secondary | ICD-10-CM | POA: Insufficient documentation

## 2013-08-24 DIAGNOSIS — Z862 Personal history of diseases of the blood and blood-forming organs and certain disorders involving the immune mechanism: Secondary | ICD-10-CM | POA: Diagnosis not present

## 2013-08-24 MED ORDER — FAMOTIDINE 20 MG PO TABS
20.0000 mg | ORAL_TABLET | Freq: Once | ORAL | Status: AC
  Administered 2013-08-24: 20 mg via ORAL
  Filled 2013-08-24: qty 1

## 2013-08-24 MED ORDER — HYDROXYZINE HCL 25 MG PO TABS
50.0000 mg | ORAL_TABLET | Freq: Once | ORAL | Status: AC
  Administered 2013-08-24: 50 mg via ORAL
  Filled 2013-08-24: qty 2

## 2013-08-24 MED ORDER — PREDNISONE 10 MG PO TABS
60.0000 mg | ORAL_TABLET | Freq: Once | ORAL | Status: AC
  Administered 2013-08-24: 60 mg via ORAL
  Filled 2013-08-24 (×2): qty 1

## 2013-08-24 NOTE — ED Notes (Signed)
Pt states she began having a rash around 6pm last night. Pt states she has taken benadryl with no relief. Pt has red, raised spots all over her body, swelling to hands and feet, and c/o right knee and foot pain.

## 2013-08-24 NOTE — ED Notes (Signed)
Pt states she was told she had the sickle cell marker during her pregnancy, but has never had a crisis. Seems to be worried about that at this time. Explained to nurse that she ate a "different" type of taco seasoning last night and broke out in rash, but due to sickel cell trait was worried about joint swelling and rash symptoms.

## 2013-08-24 NOTE — ED Provider Notes (Signed)
CSN: [id number]     Arrival date & time 08/24/13  2128 History   First MD Initiated Contact with Patient 08/24/13 2205     Chief Complaint  Patient presents with  . Rash     (Consider location/radiation/quality/duration/timing/severity/associated sxs/prior Treatment) HPI Comments: Patient is a 23 year old female who presents to the emergency department with a" rash all over my body". The patient states that approximately 6 PM on last evening, 08/23/2013, she put a different kind of seasoning on her [place]. Shortly after that she began to break out in a rash. The patient states that she took some Benadryl approximately 2:00 today, has not noticed any improvement in the rash whatsoever. She states that she has red splotches and bulbs all over her body. She also complains of swelling of the hands and feet. She states that the feet hurt when she attempts to walk. She's not had any problem swallowing, and no cough or difficulty with breathing. No known allergies according to the patient.  Patient is a 23 y.o. female presenting with rash. The history is provided by the patient.  Rash Location:  Full body Associated symptoms: no abdominal pain, no joint pain, no shortness of breath and not wheezing     Past Medical History  Diagnosis Date  . No pertinent past medical history   . Sickle cell trait   . Contraception management 04/13/2012  . Urinary frequency 10/17/2012  . Chlamydia   . History of chlamydia 06/06/2013   Past Surgical History  Procedure Laterality Date  . No past surgeries     Family History  Problem Relation Age of Onset  . Adopted: Yes  . Hypertension Mother   . Cancer Maternal Grandmother     breast   . Diabetes Maternal Grandmother    History  Substance Use Topics  . Smoking status: Never Smoker   . Smokeless tobacco: Never Used  . Alcohol Use: No   OB History   Grav Para Term Preterm Abortions TAB SAB Ect Mult Living   1 1 1  0 0 0 0 0 0 1     Review of Systems   Constitutional: Negative for activity change.       All ROS Neg except as noted in HPI  HENT: Negative for nosebleeds.   Eyes: Negative for photophobia and discharge.  Respiratory: Negative for cough, shortness of breath and wheezing.   Cardiovascular: Negative for chest pain and palpitations.  Gastrointestinal: Negative for abdominal pain and blood in stool.  Genitourinary: Negative for dysuria, frequency and hematuria.  Musculoskeletal: Negative for arthralgias, back pain and neck pain.  Skin: Positive for rash.  Neurological: Negative for dizziness, seizures and speech difficulty.  Psychiatric/Behavioral: Negative for hallucinations and confusion.      Allergies  Review of patient's allergies indicates no known allergies.  Home Medications   Prior to Admission medications   Medication Sig Start Date End Date Taking? Authorizing Provider  acetaminophen (TYLENOL) 500 MG tablet Take 500-1,000 mg by mouth every 6 (six) hours as needed for pain.    Historical Provider, MD  medroxyPROGESTERone (DEPO-PROVERA) 150 MG/ML injection Inject 1 mL (150 mg total) into the muscle every 3 (three) months. 05/17/13   Florian Buff, MD   BP 111/81  Pulse 117  Temp(Src) 99.7 F (37.6 C)  Resp 20  Ht 5\' 4"  (1.626 m)  Wt 122 lb (55.339 kg)  BMI 20.93 kg/m2  SpO2 99%  LMP 07/24/2013 Physical Exam  Nursing note and vitals reviewed. Constitutional:  She is oriented to person, place, and time. She appears well-developed and well-nourished.  Non-toxic appearance.  HENT:  Head: Normocephalic.  Right Ear: Tympanic membrane and external ear normal.  Left Ear: Tympanic membrane and external ear normal.  Eyes: EOM and lids are normal. Pupils are equal, round, and reactive to light.  Neck: Normal range of motion. Neck supple. Carotid bruit is not present.  Cardiovascular: Regular rhythm, normal heart sounds, intact distal pulses and normal pulses.  Tachycardia present.   Pulmonary/Chest: Breath sounds  normal. No respiratory distress. She has no wheezes. She has no rales.  Abdominal: Soft. Bowel sounds are normal. There is no tenderness. There is no guarding.  Musculoskeletal: Normal range of motion.  Lymphadenopathy:       Head (right side): No submandibular adenopathy present.       Head (left side): No submandibular adenopathy present.    She has no cervical adenopathy.  Neurological: She is alert and oriented to person, place, and time. She has normal strength. No cranial nerve deficit or sensory deficit.  Skin: Skin is warm and dry. Rash noted.  Patient has a few hives at the upper right and left arms. There are some red raised areas on the arms and legs. There are a few macular areas on the abdomen.  Psychiatric: She has a normal mood and affect. Her speech is normal.    ED Course  Procedures (including critical care time) Labs Review Labs Reviewed - No data to display  Imaging Review No results found.   EKG Interpretation None      MDM Patient speaks in complete sentences. Has no problem with swallowing. Patient observed while in the emergency department in there was no facial swelling or distress whatsoever. The patient was treated with Pepcid, Vistaril, and prednisone. The hives and red raised areas improved, but did not completely resolve.  The plan at this time is for the patient to use Vistaril every 6 hours as needed for itching. Patient is also to see Dr. Ishmael Holter for allergy evaluation as an outpatient.    Final diagnoses:  None    **I have reviewed nursing notes, vital signs, and all appropriate lab and imaging results for this patient.Lenox Ahr, PA-C 08/25/13 1606

## 2013-08-25 MED ORDER — HYDROXYZINE HCL 25 MG PO TABS: ORAL_TABLET | ORAL | Status: DC

## 2013-08-25 NOTE — Discharge Instructions (Signed)
Hives Hives are itchy, red, swollen areas of the skin. They can vary in size and location on your body. Hives can come and go for hours or several days (acute hives) or for several weeks (chronic hives). Hives do not spread from person to person (noncontagious). They may get worse with scratching, exercise, and emotional stress. CAUSES   Allergic reaction to food, additives, or drugs.  Infections, including the common cold.  Illness, such as vasculitis, lupus, or thyroid disease.  Exposure to sunlight, heat, or cold.  Exercise.  Stress.  Contact with chemicals. SYMPTOMS   Red or white swollen patches on the skin. The patches may change size, shape, and location quickly and repeatedly.  Itching.  Swelling of the hands, feet, and face. This may occur if hives develop deeper in the skin. DIAGNOSIS  Your caregiver can usually tell what is wrong by performing a physical exam. Skin or blood tests may also be done to determine the cause of your hives. In some cases, the cause cannot be determined. TREATMENT  Mild cases usually get better with medicines such as antihistamines. Severe cases may require an emergency epinephrine injection. If the cause of your hives is known, treatment includes avoiding that trigger.  HOME CARE INSTRUCTIONS   Avoid causes that trigger your hives.  Take antihistamines as directed by your caregiver to reduce the severity of your hives. Non-sedating or low-sedating antihistamines are usually recommended. Do not drive while taking an antihistamine.  Take any other medicines prescribed for itching as directed by your caregiver.  Wear loose-fitting clothing.  Keep all follow-up appointments as directed by your caregiver. SEEK MEDICAL CARE IF:   You have persistent or severe itching that is not relieved with medicine.  You have painful or swollen joints. SEEK IMMEDIATE MEDICAL CARE IF:   You have a fever.  Your tongue or lips are swollen.  You have  trouble breathing or swallowing.  You feel tightness in the throat or chest.  You have abdominal pain. These problems may be the first sign of a life-threatening allergic reaction. Call your local emergency services (911 in [place] MAKE SURE YOU:   Understand these instructions.  Will watch your condition.  Will get help right away if you are not doing well or get worse. Document Released: 12/21/2004 Document Revised: 12/26/2012 Document Reviewed: 03/16/2011 [employer] Patient Information 2015 [employer]. This information is not intended to replace advice given to you by your health care provider. Make sure you discuss any questions you have with your health care provider.  

## 2013-08-26 NOTE — ED Provider Notes (Signed)
Medical screening examination/treatment/procedure(s) were performed by non-physician practitioner and as supervising physician I was immediately available for consultation/collaboration.     Veryl Speak, MD 08/26/13 2040

## 2013-09-03 ENCOUNTER — Other Ambulatory Visit: Payer: Medicaid Other

## 2013-09-03 ENCOUNTER — Ambulatory Visit: Payer: Medicaid Other

## 2013-10-08 ENCOUNTER — Other Ambulatory Visit: Payer: Medicaid Other

## 2013-10-08 ENCOUNTER — Ambulatory Visit: Payer: Medicaid Other

## 2013-11-05 ENCOUNTER — Encounter (HOSPITAL_COMMUNITY): Payer: Self-pay | Admitting: Emergency Medicine

## 2013-12-06 ENCOUNTER — Encounter: Payer: Self-pay | Admitting: Adult Health

## 2013-12-06 ENCOUNTER — Other Ambulatory Visit: Payer: Medicaid Other

## 2013-12-06 ENCOUNTER — Ambulatory Visit (INDEPENDENT_AMBULATORY_CARE_PROVIDER_SITE_OTHER): Payer: Medicaid Other | Admitting: Adult Health

## 2013-12-06 DIAGNOSIS — Z3200 Encounter for pregnancy test, result unknown: Secondary | ICD-10-CM

## 2013-12-06 DIAGNOSIS — Z3042 Encounter for surveillance of injectable contraceptive: Secondary | ICD-10-CM

## 2013-12-06 LAB — HCG, QUANTITATIVE, PREGNANCY: hCG, Beta Chain, Quant, S: <1 m[IU]/mL

## 2013-12-06 MED ORDER — MEDROXYPROGESTERONE ACETATE 150 MG/ML IM SUSP
150.0000 mg | Freq: Once | INTRAMUSCULAR | Status: AC
  Administered 2013-12-06: 150 mg via INTRAMUSCULAR

## 2014-02-28 ENCOUNTER — Encounter: Payer: Self-pay | Admitting: *Deleted

## 2014-02-28 ENCOUNTER — Ambulatory Visit (INDEPENDENT_AMBULATORY_CARE_PROVIDER_SITE_OTHER): Payer: Medicaid Other | Admitting: *Deleted

## 2014-02-28 DIAGNOSIS — Z3042 Encounter for surveillance of injectable contraceptive: Secondary | ICD-10-CM

## 2014-02-28 DIAGNOSIS — Z3202 Encounter for pregnancy test, result negative: Secondary | ICD-10-CM

## 2014-02-28 LAB — POCT URINE PREGNANCY: PREG TEST UR: NEGATIVE

## 2014-02-28 MED ORDER — MEDROXYPROGESTERONE ACETATE 150 MG/ML IM SUSP
150.0000 mg | Freq: Once | INTRAMUSCULAR | Status: AC
  Administered 2014-02-28: 150 mg via INTRAMUSCULAR

## 2014-02-28 NOTE — Progress Notes (Signed)
Pt here for Depo. Reports no problems at this time. Return in 12 weeks for next shot. [name] 

## 2014-05-23 ENCOUNTER — Other Ambulatory Visit: Payer: Self-pay | Admitting: *Deleted

## 2014-05-23 ENCOUNTER — Ambulatory Visit: Payer: Medicaid Other

## 2014-05-23 MED ORDER — MEDROXYPROGESTERONE ACETATE 150 MG/ML IM SUSP: 150.0000 mg | INTRAMUSCULAR | Status: DC

## 2014-05-27 ENCOUNTER — Ambulatory Visit: Payer: Medicaid Other

## 2014-06-15 ENCOUNTER — Emergency Department (HOSPITAL_COMMUNITY): Payer: Medicaid Other

## 2014-06-15 ENCOUNTER — Emergency Department (HOSPITAL_COMMUNITY)
Admission: EM | Admit: 2014-06-15 | Discharge: 2014-06-15 | Disposition: A | Discharge status: Home / Self Care | Payer: Medicaid Other | Attending: Emergency Medicine | Admitting: Emergency Medicine

## 2014-06-15 ENCOUNTER — Encounter (HOSPITAL_COMMUNITY): Payer: Self-pay | Admitting: Emergency Medicine

## 2014-06-15 DIAGNOSIS — Z3202 Encounter for pregnancy test, result negative: Secondary | ICD-10-CM | POA: Insufficient documentation

## 2014-06-15 DIAGNOSIS — R590 Localized enlarged lymph nodes: Secondary | ICD-10-CM

## 2014-06-15 DIAGNOSIS — Z8619 Personal history of other infectious and parasitic diseases: Secondary | ICD-10-CM | POA: Insufficient documentation

## 2014-06-15 DIAGNOSIS — Z793 Long term (current) use of hormonal contraceptives: Secondary | ICD-10-CM | POA: Insufficient documentation

## 2014-06-15 DIAGNOSIS — R221 Localized swelling, mass and lump, neck: Secondary | ICD-10-CM | POA: Diagnosis present

## 2014-06-15 DIAGNOSIS — Z862 Personal history of diseases of the blood and blood-forming organs and certain disorders involving the immune mechanism: Secondary | ICD-10-CM | POA: Diagnosis not present

## 2014-06-15 LAB — CBC WITH DIFFERENTIAL/PLATELET
BASOS ABS: 0 10*3/uL (ref 0.0–0.1)
Basophils Relative: 0 % (ref 0–1)
Eosinophils Absolute: 0.1 10*3/uL (ref 0.0–0.7)
Eosinophils Relative: 1 % (ref 0–5)
HCT: 39.7 % (ref 36.0–46.0)
Hemoglobin: 13.6 g/dL (ref 12.0–15.0)
Lymphocytes Relative: 27 % (ref 12–46)
Lymphs Abs: 2.5 10*3/uL (ref 0.7–4.0)
MCH: 28.8 pg (ref 26.0–34.0)
MCHC: 34.3 g/dL (ref 30.0–36.0)
MCV: 84.1 fL (ref 78.0–100.0)
MONO ABS: 1 10*3/uL (ref 0.1–1.0)
Monocytes Relative: 11 % (ref 3–12)
Neutro Abs: 5.4 10*3/uL (ref 1.7–7.7)
Neutrophils Relative %: 61 % (ref 43–77)
Platelets: 286 10*3/uL (ref 150–400)
RBC: 4.72 MIL/uL (ref 3.87–5.11)
RDW: 12.2 % (ref 11.5–15.5)
WBC: 9.1 10*3/uL (ref 4.0–10.5)

## 2014-06-15 LAB — MONONUCLEOSIS SCREEN: Mono Screen: NEGATIVE

## 2014-06-15 LAB — POC URINE PREG, ED: Preg Test, Ur: NEGATIVE

## 2014-06-15 MED ORDER — LIDOCAINE HCL (PF) 1 % IJ SOLN
INTRAMUSCULAR | Status: AC
  Filled 2014-06-15: qty 5

## 2014-06-15 MED ORDER — IOHEXOL 300 MG/ML  SOLN
75.0000 mL | Freq: Once | INTRAMUSCULAR | Status: AC | PRN
  Administered 2014-06-15: 75 mL via INTRAVENOUS

## 2014-06-15 MED ORDER — NAPROXEN 500 MG PO TABS: 500.0000 mg | ORAL_TABLET | Freq: Two times a day (BID) | ORAL | Status: DC

## 2014-06-15 NOTE — Discharge Instructions (Signed)
Follow up with your doctor for recheck within the next week. Return here if symptoms worsen.

## 2014-06-15 NOTE — ED Notes (Signed)
While triaging pt, notice pt's right cheek is swollen.  Pt says she has not noticed it but was told by two family member that her face was swollen.  Pt says she was told 2 weeks ago her face was swollen and last week by another family member her face was swollen.  Pt did not address it because she was not in any pain.  Pt is here today for lump to left side of throat.

## 2014-06-15 NOTE — ED Provider Notes (Signed)
CSN: [id number]     Arrival date & time 06/15/14  1639 History   First MD Initiated Contact with Patient 06/15/14 1657     Chief Complaint  Patient presents with  . Mass    to throat      (Consider location/radiation/quality/duration/timing/severity/associated sxs/prior Treatment) Patient is a 24 y.o. female presenting with pharyngitis. The history is provided by the patient.  Sore Throat This is a new problem. The current episode started in the past 7 days. The problem occurs constantly.   ANKITA NEWCOMER is a 24 y.o. female who presents to the ED with swollen gland under her chin that she noted today. She states that for the past week her family has been telling her that her face looked swollen but she didn't think so and was not having any pain. Today the swelling under her chin causes her to have throat pain with swallowing. She denies fever, n/v, difficulty swallowing or dental problems.   Past Medical History  Diagnosis Date  . No pertinent past medical history   . Sickle cell trait   . Contraception management 04/13/2012  . Urinary frequency 10/17/2012  . Chlamydia   . History of chlamydia 06/06/2013   Past Surgical History  Procedure Laterality Date  . No past surgeries     Family History  Problem Relation Age of Onset  . Adopted: Yes  . Hypertension Mother   . Cancer Maternal Grandmother     breast   . Diabetes Maternal Grandmother    History  Substance Use Topics  . Smoking status: Never Smoker   . Smokeless tobacco: Never Used  . Alcohol Use: No   OB History    Gravida Para Term Preterm AB TAB SAB Ectopic Multiple Living   1 1 1  0 0 0 0 0 0 1     Review of Systems Negative except as stated in HPI   Allergies  Bee venom  Home Medications   Prior to Admission medications   Medication Sig Start Date End Date Taking? Authorizing Provider  medroxyPROGESTERone (DEPO-PROVERA) 150 MG/ML injection Inject 1 mL (150 mg total) into the muscle every 3  (three) months. 05/23/14  Yes Estill Dooms, NP  naproxen (NAPROSYN) 500 MG tablet Take 1 tablet (500 mg total) by mouth 2 (two) times daily. 06/15/14   Latrece Nitta Bunnie Pion, NP   BP 110/79 mmHg  Pulse 82  Temp(Src) 99.1 F (37.3 C) (Oral)  Resp 16  Ht 5\' 4"  (1.626 m)  Wt 135 lb 14.4 oz (61.644 kg)  BMI 23.32 kg/m2  SpO2 99%  LMP 06/01/2014 Physical Exam  Constitutional: She is oriented to person, place, and time. She appears well-developed and well-nourished. No distress.  HENT:  Right Ear: Tympanic membrane and external ear normal.  Left Ear: Tympanic membrane and external ear normal.  Nose: Nose normal.  Mouth/Throat: Uvula is midline, oropharynx is clear and moist and mucous membranes are normal. No oral lesions. No trismus in the jaw. Normal dentition. No dental abscesses or dental caries. No tonsillar abscesses.  Eyes: Conjunctivae and EOM are normal.  Neck: Normal range of motion. Neck supple.    Enlarged lymph node.   Pulmonary/Chest: Effort normal.  Abdominal: Soft. There is no tenderness.  Musculoskeletal: Normal range of motion.  Neurological: She is alert and oriented to person, place, and time. No cranial nerve deficit.  Skin: Skin is warm and dry.  Psychiatric: She has a normal mood and affect. Her behavior is normal.  Nursing  note and vitals reviewed.   ED Course  Procedures (including critical care time) Dr. Roderic Palau in to examine the patient and review results of CT scan.  Ct Soft Tissue Neck W Contrast  06/15/2014   CLINICAL DATA:  Lump along the left side of the mandible, developed yesterday. Right cheek swelling, 2 weeks duration.  EXAM: CT NECK WITH CONTRAST  TECHNIQUE: Multidetector CT imaging of the neck was performed using the standard protocol following the bolus administration of intravenous contrast.  CONTRAST:  57mL OMNIPAQUE IOHEXOL 300 MG/ML  SOLN  COMPARISON:  None.  FINDINGS: Pharynx and larynx: No mucosal or submucosal lesion. The larynx appears normal.   Salivary glands: Both submandibular glands are symmetric and normal. Both parotid glands are normal.  Thyroid: Normal  Lymph nodes: There is an enlarged level 1 (submandibular) lymph node to the left of midline measuring 16 by 11 mm, corresponding to the palpable abnormality. This is a nonspecific lymph node. No low-density. No other enlarged or asymmetric lymph nodes within the neck. There are normal size lymph nodes bilaterally at the other nodal stations.  Vascular: Arterial and venous structures are normal.  Limited intracranial: Normal  Visualized orbits: Normal  Mastoids and visualized paranasal sinuses: Clear  Skeleton: Normal  Upper chest: Clear  IMPRESSION: The abnormality of concern represents a 16 x 11 mm level 1 (submandibular) lymph node on the left without low-density or surrounding inflammatory change. This is nonspecific and presumably a reactive node. The other nodes in the neck are within normal limits. This could be followed clinically.   Electronically Signed   By: Nelson Chimes M.D.   On: 06/15/2014 20:01     MDM  24 y.o. female with swollen lymph node and mild pain. Stable for d/c without difficulty swallowing. Will treat for pain and inflammation and she will see her PCP for follow up. She will return here as needed for any problems.   Final diagnoses:  Submandibular lymphadenopathy       [facility], NP 06/18/14 4163  Milton Ferguson, MD 06/19/14 [phone]

## 2014-06-15 NOTE — ED Notes (Signed)
Notice lump to throat on left side yesterday.  C/o pain, rates 5/10.  Have not taken any medication for pain today.

## 2014-06-26 ENCOUNTER — Emergency Department (HOSPITAL_COMMUNITY)
Admission: EM | Admit: 2014-06-26 | Discharge: 2014-06-26 | Disposition: A | Discharge status: Home / Self Care | Payer: Medicaid Other | Attending: Emergency Medicine | Admitting: Emergency Medicine

## 2014-06-26 ENCOUNTER — Encounter (HOSPITAL_COMMUNITY): Payer: Self-pay | Admitting: Emergency Medicine

## 2014-06-26 DIAGNOSIS — L03116 Cellulitis of left lower limb: Secondary | ICD-10-CM | POA: Diagnosis not present

## 2014-06-26 DIAGNOSIS — Y998 Other external cause status: Secondary | ICD-10-CM | POA: Insufficient documentation

## 2014-06-26 DIAGNOSIS — S80862A Insect bite (nonvenomous), left lower leg, initial encounter: Secondary | ICD-10-CM | POA: Insufficient documentation

## 2014-06-26 DIAGNOSIS — Z793 Long term (current) use of hormonal contraceptives: Secondary | ICD-10-CM | POA: Diagnosis not present

## 2014-06-26 DIAGNOSIS — W57XXXA Bitten or stung by nonvenomous insect and other nonvenomous arthropods, initial encounter: Secondary | ICD-10-CM | POA: Insufficient documentation

## 2014-06-26 DIAGNOSIS — Y9289 Other specified places as the place of occurrence of the external cause: Secondary | ICD-10-CM | POA: Diagnosis not present

## 2014-06-26 DIAGNOSIS — Z8619 Personal history of other infectious and parasitic diseases: Secondary | ICD-10-CM | POA: Insufficient documentation

## 2014-06-26 DIAGNOSIS — Y9389 Activity, other specified: Secondary | ICD-10-CM | POA: Insufficient documentation

## 2014-06-26 MED ORDER — CEPHALEXIN 500 MG PO CAPS: 500.0000 mg | ORAL_CAPSULE | Freq: Four times a day (QID) | ORAL | Status: DC

## 2014-06-26 MED ORDER — SULFAMETHOXAZOLE-TRIMETHOPRIM 800-160 MG PO TABS: 1.0000 | ORAL_TABLET | Freq: Two times a day (BID) | ORAL | Status: AC

## 2014-06-26 NOTE — ED Provider Notes (Addendum)
CSN: [id number]     Arrival date & time 06/26/14  1946 History   First MD Initiated Contact with Patient 06/26/14 1954     Chief Complaint  Patient presents with  . Insect Bite     (Consider location/radiation/quality/duration/timing/severity/associated sxs/prior Treatment) HPI Comments:  Patient states "insect bite" and she is to left ankle. Does not remember being bitten. States happened during her sleep. Denies any trauma. Denies fever eyes vomiting denies any other injuries. Denies sick contacts. He has been applying Wound ointment without relief. Notably she got a tattoo just proximal to this area 3 days ago. Denies any IVDA.  The history is provided by the patient.    Past Medical History  Diagnosis Date  . No pertinent past medical history   . Sickle cell trait   . Contraception management 04/13/2012  . Urinary frequency 10/17/2012  . Chlamydia   . History of chlamydia 06/06/2013   Past Surgical History  Procedure Laterality Date  . No past surgeries     Family History  Problem Relation Age of Onset  . Adopted: Yes  . Hypertension Mother   . Cancer Maternal Grandmother     breast   . Diabetes Maternal Grandmother    History  Substance Use Topics  . Smoking status: Never Smoker   . Smokeless tobacco: Never Used  . Alcohol Use: No   OB History    Gravida Para Term Preterm AB TAB SAB Ectopic Multiple Living   1 1 1  0 0 0 0 0 0 1     Review of Systems  Constitutional: Negative for fever, activity change and appetite change.  Eyes: Negative for visual disturbance.  Respiratory: Negative for cough, chest tightness and shortness of breath.   Cardiovascular: Negative for chest pain.  Gastrointestinal: Negative for nausea, vomiting and abdominal pain.  Genitourinary: Negative for dysuria, hematuria, vaginal bleeding and vaginal discharge.  Musculoskeletal: Negative for myalgias and arthralgias.  Skin: Positive for wound.  Neurological: Negative for dizziness,  light-headedness and headaches.  A complete 10 system review of systems was obtained and all systems are negative except as noted in the HPI and PMH.      Allergies  Bee venom  Home Medications   Prior to Admission medications   Medication Sig Start Date End Date Taking? Authorizing Provider  cephALEXin (KEFLEX) 500 MG capsule Take 1 capsule (500 mg total) by mouth 4 (four) times daily. 06/26/14   Ezequiel Essex, MD  medroxyPROGESTERone (DEPO-PROVERA) 150 MG/ML injection Inject 1 mL (150 mg total) into the muscle every 3 (three) months. 05/23/14   Estill Dooms, NP  naproxen (NAPROSYN) 500 MG tablet Take 1 tablet (500 mg total) by mouth 2 (two) times daily. 06/15/14   Hope Bunnie Pion, NP  sulfamethoxazole-trimethoprim (BACTRIM DS,SEPTRA DS) 800-160 MG per tablet Take 1 tablet by mouth 2 (two) times daily. 06/26/14 07/03/14  Ezequiel Essex, MD   BP 102/63 mmHg  Pulse 85  Temp(Src) 98.9 F (37.2 C) (Oral)  Resp 24  Ht 5\' 4"  (1.626 m)  Wt 135 lb (61.236 kg)  BMI 23.16 kg/m2  SpO2 98%  LMP 05/22/2014 Physical Exam  Constitutional: She is oriented to person, place, and time. She appears well-developed and well-nourished. No distress.  HENT:  Head: Normocephalic and atraumatic.  Mouth/Throat: Oropharynx is clear and moist. No oropharyngeal exudate.  Eyes: Conjunctivae and EOM are normal. Pupils are equal, round, and reactive to light.  Neck: Normal range of motion. Neck supple.  No meningismus.  Cardiovascular: Normal rate, regular rhythm, normal heart sounds and intact distal pulses.   No murmur heard. Pulmonary/Chest: Effort normal and breath sounds normal. No respiratory distress.  Abdominal: Soft. There is no tenderness. There is no rebound and no guarding.  Musculoskeletal: Normal range of motion. She exhibits edema and tenderness.  Abrasion and cellulitis to L lower leg FROM ankle joint.   Intact DP and PT pulses  erythema just distal to patient's recent tattoo. Full range  of motion of ankle joint.   Neurological: She is alert and oriented to person, place, and time. No cranial nerve deficit. She exhibits normal muscle tone. Coordination normal.  No ataxia on finger to nose bilaterally. No pronator drift. 5/5 strength throughout. CN 2-12 intact. Negative Romberg. Equal grip strength. Sensation intact. Gait is normal.   Skin: Skin is warm. There is erythema.  Psychiatric: She has a normal mood and affect. Her behavior is normal.  Nursing note and vitals reviewed.      ED Course  Procedures (including critical care time) Labs Review Labs Reviewed - No data to display  Imaging Review No results found.   EKG Interpretation None      MDM   Final diagnoses:  Cellulitis of leg without foot, left    cellulitis near area of recent tattoo. Abrasion to skin. No fever or vomiting.   Neurovascular intact. No evidence of septic joint. Systemically appears well. Nontoxic.   start antibiotics for cellulitis. Wound recheck in 2 days. Return precautions discussed.   Ezequiel Essex, MD 06/26/14 0223  Ezequiel Essex, MD 06/26/14 3612

## 2014-06-26 NOTE — ED Notes (Signed)
Pt c/o insect bite to the left ankle, but appears to look like an abrasion.

## 2014-06-26 NOTE — Discharge Instructions (Signed)

## 2014-07-23 ENCOUNTER — Telehealth: Payer: Self-pay | Admitting: Adult Health

## 2014-07-23 ENCOUNTER — Ambulatory Visit (INDEPENDENT_AMBULATORY_CARE_PROVIDER_SITE_OTHER): Payer: Medicaid Other | Admitting: Adult Health

## 2014-07-23 ENCOUNTER — Ambulatory Visit (HOSPITAL_COMMUNITY)
Admission: RE | Admit: 2014-07-23 | Discharge: 2014-07-23 | Disposition: A | Discharge status: Home / Self Care | Payer: Medicaid Other | Source: Ambulatory Visit | Attending: Adult Health | Admitting: Adult Health

## 2014-07-23 ENCOUNTER — Encounter: Payer: Self-pay | Admitting: Adult Health

## 2014-07-23 VITALS — BP 98/62 | HR 80 | Ht 64.0 in | Wt 136.5 lb

## 2014-07-23 DIAGNOSIS — R0781 Pleurodynia: Secondary | ICD-10-CM | POA: Diagnosis not present

## 2014-07-23 DIAGNOSIS — R05 Cough: Secondary | ICD-10-CM | POA: Insufficient documentation

## 2014-07-23 DIAGNOSIS — R059 Cough, unspecified: Secondary | ICD-10-CM

## 2014-07-23 DIAGNOSIS — Z3202 Encounter for pregnancy test, result negative: Secondary | ICD-10-CM

## 2014-07-23 DIAGNOSIS — R918 Other nonspecific abnormal finding of lung field: Secondary | ICD-10-CM | POA: Diagnosis not present

## 2014-07-23 DIAGNOSIS — R079 Chest pain, unspecified: Secondary | ICD-10-CM | POA: Insufficient documentation

## 2014-07-23 HISTORY — DX: Pleurodynia: R07.81

## 2014-07-23 HISTORY — DX: Cough, unspecified: R05.9

## 2014-07-23 LAB — POCT URINE PREGNANCY: Preg Test, Ur: NEGATIVE

## 2014-07-23 MED ORDER — AZITHROMYCIN 500 MG PO TABS: ORAL_TABLET | ORAL | Status: DC

## 2014-07-23 MED ORDER — LEVOFLOXACIN 750 MG PO TABS: 750.0000 mg | ORAL_TABLET | Freq: Every day | ORAL | Status: DC

## 2014-07-23 NOTE — Telephone Encounter (Signed)
Pt aware has pneumonia, will treat with Levaquin and azithromycin and se 8/2 in F/U and get chest xray the next week

## 2014-07-23 NOTE — Patient Instructions (Signed)
Try mucinex and delsym And advil  Will talk when x ray back

## 2014-07-23 NOTE — Progress Notes (Signed)
Subjective:     Patient ID: Deanna Sheppard, female   DOB: [birth year], 24 y.o.   MRN: [id number]  HPI Mallery is a 24 year old black female in complaining of pain in left breast area and chest and cough for 1 week. Has green mucous.Headache at times, no head congestion.It hurts to take deep breath.No known injury to ribs.Her boy friend has cough and congestion too.  Review of Systems Patient denies any hearing loss, fatigue, blurred vision, shortness of breath, abdominal pain, problems with bowel movements, urination, or intercourse. No joint pain or mood swings.See HPI for positives.  Reviewed past medical,surgical, social and family history. Reviewed medications and allergies.     Objective:   Physical Exam BP 98/62 mmHg  Pulse 80  Ht 5\' 4"  (1.626 m)  Wt 136 lb 8 oz (61.916 kg)  BMI 23.42 kg/m2  LMP 06/19/2014 UPT negative, Skin warm and dry. Neck: mid line trachea, normal thyroid, good ROM, no lymphadenopathy noted. Lungs: clear to ausculation bilaterally. Cardiovascular: regular rate and rhythm, has pain in left ribs and scapula area with palpation.  Breasts:no dominate palpable mass, retraction or nipple discharge    Assessment:     Left rib pain Cough     Plan:     Will get chest xray and left ribs now Try mucinex and delsym and advil Will talk when x rays back   Follow up prn

## 2014-07-24 ENCOUNTER — Emergency Department (HOSPITAL_COMMUNITY): Payer: Medicaid Other

## 2014-07-24 ENCOUNTER — Encounter (HOSPITAL_COMMUNITY): Payer: Self-pay | Admitting: Emergency Medicine

## 2014-07-24 ENCOUNTER — Emergency Department (HOSPITAL_COMMUNITY)
Admission: EM | Admit: 2014-07-24 | Discharge: 2014-07-24 | Disposition: A | Discharge status: Home / Self Care | Payer: Medicaid Other | Attending: Emergency Medicine | Admitting: Emergency Medicine

## 2014-07-24 DIAGNOSIS — Z862 Personal history of diseases of the blood and blood-forming organs and certain disorders involving the immune mechanism: Secondary | ICD-10-CM | POA: Diagnosis not present

## 2014-07-24 DIAGNOSIS — Z8619 Personal history of other infectious and parasitic diseases: Secondary | ICD-10-CM | POA: Insufficient documentation

## 2014-07-24 DIAGNOSIS — Z791 Long term (current) use of non-steroidal anti-inflammatories (NSAID): Secondary | ICD-10-CM | POA: Diagnosis not present

## 2014-07-24 DIAGNOSIS — R079 Chest pain, unspecified: Secondary | ICD-10-CM | POA: Diagnosis present

## 2014-07-24 DIAGNOSIS — R61 Generalized hyperhidrosis: Secondary | ICD-10-CM | POA: Insufficient documentation

## 2014-07-24 DIAGNOSIS — Z8701 Personal history of pneumonia (recurrent): Secondary | ICD-10-CM | POA: Insufficient documentation

## 2014-07-24 DIAGNOSIS — R112 Nausea with vomiting, unspecified: Secondary | ICD-10-CM | POA: Diagnosis not present

## 2014-07-24 DIAGNOSIS — R0602 Shortness of breath: Secondary | ICD-10-CM | POA: Insufficient documentation

## 2014-07-24 DIAGNOSIS — R059 Cough, unspecified: Secondary | ICD-10-CM

## 2014-07-24 DIAGNOSIS — Z792 Long term (current) use of antibiotics: Secondary | ICD-10-CM | POA: Insufficient documentation

## 2014-07-24 DIAGNOSIS — R05 Cough: Secondary | ICD-10-CM | POA: Insufficient documentation

## 2014-07-24 LAB — CBC
HCT: 40.6 % (ref 36.0–46.0)
Hemoglobin: 14.1 g/dL (ref 12.0–15.0)
MCH: 28.8 pg (ref 26.0–34.0)
MCHC: 34.7 g/dL (ref 30.0–36.0)
MCV: 82.9 fL (ref 78.0–100.0)
Platelets: 282 10*3/uL (ref 150–400)
RBC: 4.9 MIL/uL (ref 3.87–5.11)
RDW: 12.4 % (ref 11.5–15.5)
WBC: 14.2 10*3/uL — AB (ref 4.0–10.5)

## 2014-07-24 LAB — D-DIMER, QUANTITATIVE (NOT AT ARMC): D DIMER QUANT: 0.47 ug{FEU}/mL (ref 0.00–0.48)

## 2014-07-24 LAB — BASIC METABOLIC PANEL
Anion gap: 11 (ref 5–15)
BUN: 9 mg/dL (ref 6–20)
CO2: 24 mmol/L (ref 22–32)
Calcium: 9.4 mg/dL (ref 8.9–10.3)
Chloride: 99 mmol/L — ABNORMAL LOW (ref 101–111)
Creatinine, Ser: 0.79 mg/dL (ref 0.44–1.00)
GFR calc Af Amer: >60 mL/min (ref >60)
Glucose, Bld: 125 mg/dL — ABNORMAL HIGH (ref 65–99)
Potassium: 3.1 mmol/L — ABNORMAL LOW (ref 3.5–5.1)
SODIUM: 134 mmol/L — AB (ref 135–145)

## 2014-07-24 MED ORDER — ONDANSETRON 8 MG PO TBDP: 8.0000 mg | ORAL_TABLET | Freq: Three times a day (TID) | ORAL | Status: DC | PRN

## 2014-07-24 MED ORDER — PROMETHAZINE HCL 25 MG/ML IJ SOLN
25.0000 mg | Freq: Once | INTRAMUSCULAR | Status: AC
  Administered 2014-07-24: 25 mg via INTRAMUSCULAR
  Filled 2014-07-24: qty 1

## 2014-07-24 MED ORDER — IBUPROFEN 600 MG PO TABS: 600.0000 mg | ORAL_TABLET | Freq: Three times a day (TID) | ORAL | Status: DC | PRN

## 2014-07-24 MED ORDER — ONDANSETRON 8 MG PO TBDP
8.0000 mg | ORAL_TABLET | Freq: Once | ORAL | Status: AC
  Administered 2014-07-24: 8 mg via ORAL
  Filled 2014-07-24: qty 1

## 2014-07-24 MED ORDER — OXYCODONE-ACETAMINOPHEN 5-325 MG PO TABS
2.0000 | ORAL_TABLET | Freq: Once | ORAL | Status: AC
  Administered 2014-07-24: 2 via ORAL
  Filled 2014-07-24: qty 2

## 2014-07-24 MED ORDER — KETOROLAC TROMETHAMINE 60 MG/2ML IM SOLN
60.0000 mg | Freq: Once | INTRAMUSCULAR | Status: AC
  Administered 2014-07-24: 60 mg via INTRAMUSCULAR
  Filled 2014-07-24: qty 2

## 2014-07-24 NOTE — ED Notes (Signed)
C/o arm pain and back pain.  Rates arm 7/10 and back pain 10/10.  Pt was seen by Laurann Montana, NP at [facility] yesterday and was treated with Azithromycin and Levofloxacin for pneumonia per pts.Marland Kitchen

## 2014-07-24 NOTE — ED Notes (Signed)
Pt vomiting.  Dr Venora Maples informed.  Hold discharge for now.

## 2014-07-24 NOTE — ED Notes (Signed)
Given crackers per request.

## 2014-07-24 NOTE — ED Provider Notes (Signed)
CSN: [id number]     Arrival date & time 07/24/14  1023 History  This chart was scribed for Jola Schmidt, MD by Julien Nordmann, ED Scribe. This patient was seen in room APA07/APA07 and the patient's care was started at 10:53 AM.    No chief complaint on file.    The history is provided by the patient. No language interpreter was used.   HPI Comments: Deanna Sheppard is a 24 y.o. female who presents to the Emergency Department complaining of left breast pain that radiates to her right shoulder onset since yesterday. She has associated cough and states she has been breaking out in sweats. Pt notes when she lays down she has shortness of breath and she is very tense.  Pt reports seeing her PCP and was referred to the ED, had some testing done and was dx with pneumonia in her breast, chest and ribs. She was treated with 2 antibiotics Azithromyicn and Levofloxacin and notes "they have not been sitting well". Pt states her pain gets worse when she takes a deep breath.  She denies abdominal pain and nipple drainage.  Past Medical History  Diagnosis Date  . No pertinent past medical history   . Sickle cell trait   . Contraception management 04/13/2012  . Urinary frequency 10/17/2012  . Chlamydia   . History of chlamydia 06/06/2013  . Rib pain on left side 07/23/2014  . Cough 07/23/2014   Past Surgical History  Procedure Laterality Date  . No past surgeries     Family History  Problem Relation Age of Onset  . Adopted: Yes  . Hypertension Mother   . Cancer Maternal Grandmother     breast   . Diabetes Maternal Grandmother    History  Substance Use Topics  . Smoking status: Never Smoker   . Smokeless tobacco: Never Used  . Alcohol Use: No   OB History    Gravida Para Term Preterm AB TAB SAB Ectopic Multiple Living   1 1 1  0 0 0 0 0 0 1     Review of Systems  A complete 10 system review of systems was obtained and all systems are negative except as noted in the HPI and PMH.     Allergies  Bee venom  Home Medications   Prior to Admission medications   Medication Sig Start Date End Date Taking? Authorizing Provider  azithromycin (ZITHROMAX) 500 MG tablet Take 1 daily x 10 days 07/23/14   Estill Dooms, NP  levofloxacin (LEVAQUIN) 750 MG tablet Take 1 tablet (750 mg total) by mouth daily. 07/23/14   Estill Dooms, NP  naproxen (NAPROSYN) 500 MG tablet Take 1 tablet (500 mg total) by mouth 2 (two) times daily. 06/15/14   [place], NP   Triage vitals: BP 112/76 mmHg  Pulse 92  Temp(Src) 98.9 F (37.2 C) (Oral)  Resp 13  Ht 5\' 4"  (1.626 m)  Wt 136 lb (61.689 kg)  BMI 23.33 kg/m2  SpO2 100%  LMP 06/19/2014 Physical Exam  Constitutional: She is oriented to person, place, and time. She appears well-developed and well-nourished. No distress.  HENT:  Head: Normocephalic and atraumatic.  Eyes: EOM are normal.  Neck: Normal range of motion.  Cardiovascular: Normal rate, regular rhythm and normal heart sounds.   Pulmonary/Chest: Effort normal and breath sounds normal.  Normal appearing left breast, mild anterior left chest wall tenderness  Abdominal: Soft. She exhibits no distension. There is no tenderness.  Musculoskeletal: Normal range of  motion. She exhibits no edema.  Neurological: She is alert and oriented to person, place, and time.  Skin: Skin is warm and dry.  Psychiatric: She has a normal mood and affect. Judgment normal.  Nursing note and vitals reviewed.   ED Course  Procedures  DIAGNOSTIC STUDIES: Oxygen Saturation is 100% on RA, normal by my interpretation.  COORDINATION OF CARE: 10:34 AM Discussed treatment plan which includes CXR and pain medication with pt at bedside and pt agreed to plan.  Labs Review Labs Reviewed  CBC - Abnormal; Notable for the following:    WBC 14.2 (*)    All other components within normal limits  BASIC METABOLIC PANEL - Abnormal; Notable for the following:    Sodium 134 (*)    Potassium 3.1 (*)     Chloride 99 (*)    Glucose, Bld 125 (*)    All other components within normal limits  D-DIMER, QUANTITATIVE (NOT AT [facility])    Imaging Review Dg Chest 2 View  07/24/2014   CLINICAL DATA:  Worsening left-sided chest pain, cough and congestion for 2 weeks.  EXAM: CHEST  2 VIEW  COMPARISON:  Chest x-ray dated 07/23/2014.  FINDINGS: The left perihilar opacity identified on yesterday's chest x-ray is less apparent on today's study. Remainder of the lungs are clear. No pleural effusion. No pneumothorax.  Cardiomediastinal silhouette remains normal in size and configuration. No osseous abnormality identified.  IMPRESSION: The left perihilar opacity identified on yesterday's chest x-ray as suspicious for a perihilar pneumonia is less prominent on today's study, but a streaky opacity does persist over the left heart border which again could represent pneumonia.  No new lung findings. There has certainly been no worsening of the previous findings.   Electronically Signed   By: Franki Cabot M.D.   On: 07/24/2014 11:48   Dg Chest 2 View  07/23/2014   CLINICAL DATA:  Left chest pain.  Cough.  EXAM: CHEST  2 VIEW  COMPARISON:  None.  FINDINGS: Mediastinum hilar structures are normal. Heart size normal . Left perihilar infiltrate is noted. No pleural effusion or pneumothorax. No acute bony abnormality identified.  IMPRESSION: Left perihilar infiltrate consistent with pneumonia. Followup PA and lateral chest X-ray is recommended in 3-4 weeks following trial of antibiotic therapy to ensure resolution and exclude underlying malignancy.   Electronically Signed   By: Marcello Moores  Register   On: 07/23/2014 10:32   Dg Ribs Unilateral Left  07/23/2014   CLINICAL DATA:  Left-sided rib pain  EXAM: LEFT RIBS - 2 VIEW  COMPARISON:  None.  FINDINGS: No fracture or other bone lesions are seen involving the ribs.  IMPRESSION: Negative.   Electronically Signed   By: Kathreen Devoid   On: 07/23/2014 10:40  I personally reviewed the  imaging tests through PACS system I reviewed available ER/hospitalization records through the EMR    EKG Interpretation None      MDM   Final diagnoses:  Cough  Chest pain, unspecified chest pain type    Her pain seems to be more pleuritic in nature.  Chest x-ray is not worse.  D-dimer is normal.  Patient feels better after anti-inflammatories and pain medication.  Discharge home in good condition.  Patient will continue her antibiotics  I personally performed the services described in this documentation, which was scribed in my presence. The recorded information has been reviewed and is accurate.     Jola Schmidt, MD 07/24/14 [phone]

## 2014-07-24 NOTE — ED Notes (Signed)
MD at bedside. 

## 2014-08-06 ENCOUNTER — Encounter: Payer: Self-pay | Admitting: *Deleted

## 2014-08-06 ENCOUNTER — Ambulatory Visit: Payer: Medicaid Other | Admitting: Adult Health

## 2014-08-29 ENCOUNTER — Ambulatory Visit: Payer: Medicaid Other

## 2014-08-29 ENCOUNTER — Other Ambulatory Visit: Payer: Medicaid Other

## 2014-09-26 ENCOUNTER — Other Ambulatory Visit: Payer: Medicaid Other

## 2014-09-26 ENCOUNTER — Encounter: Payer: Self-pay | Admitting: *Deleted

## 2014-09-26 ENCOUNTER — Ambulatory Visit (INDEPENDENT_AMBULATORY_CARE_PROVIDER_SITE_OTHER): Payer: Medicaid Other | Admitting: *Deleted

## 2014-09-26 DIAGNOSIS — Z3042 Encounter for surveillance of injectable contraceptive: Secondary | ICD-10-CM | POA: Diagnosis not present

## 2014-09-26 LAB — BETA HCG QUANT (REF LAB): hCG Quant: <1 m[IU]/mL

## 2014-09-26 MED ORDER — MEDROXYPROGESTERONE ACETATE 150 MG/ML IM SUSP
150.0000 mg | Freq: Once | INTRAMUSCULAR | Status: AC
  Administered 2014-09-26: 150 mg via INTRAMUSCULAR

## 2014-09-26 NOTE — Progress Notes (Signed)
Patient ID: Deanna Sheppard, female   DOB: 01-Jan-1991, 24 y.o.   MRN: [id number] Pt here today for DEPO injection. Pt had QHCG drawn this am and it is negative. Pt denies any problems or concerns at this time.

## 2014-11-04 ENCOUNTER — Encounter (HOSPITAL_COMMUNITY): Payer: Self-pay | Admitting: Emergency Medicine

## 2014-11-04 ENCOUNTER — Emergency Department (HOSPITAL_COMMUNITY): Payer: Medicaid Other

## 2014-11-04 ENCOUNTER — Emergency Department (HOSPITAL_COMMUNITY)
Admission: EM | Admit: 2014-11-04 | Discharge: 2014-11-05 | Disposition: A | Discharge status: Home / Self Care | Payer: Medicaid Other | Attending: Emergency Medicine | Admitting: Emergency Medicine

## 2014-11-04 DIAGNOSIS — S0992XA Unspecified injury of nose, initial encounter: Secondary | ICD-10-CM | POA: Diagnosis present

## 2014-11-04 DIAGNOSIS — S0033XA Contusion of nose, initial encounter: Secondary | ICD-10-CM

## 2014-11-04 DIAGNOSIS — Z862 Personal history of diseases of the blood and blood-forming organs and certain disorders involving the immune mechanism: Secondary | ICD-10-CM | POA: Insufficient documentation

## 2014-11-04 DIAGNOSIS — Y9389 Activity, other specified: Secondary | ICD-10-CM | POA: Diagnosis not present

## 2014-11-04 DIAGNOSIS — Y998 Other external cause status: Secondary | ICD-10-CM | POA: Insufficient documentation

## 2014-11-04 DIAGNOSIS — Y9289 Other specified places as the place of occurrence of the external cause: Secondary | ICD-10-CM | POA: Insufficient documentation

## 2014-11-04 DIAGNOSIS — S0081XA Abrasion of other part of head, initial encounter: Secondary | ICD-10-CM | POA: Insufficient documentation

## 2014-11-04 DIAGNOSIS — Z8619 Personal history of other infectious and parasitic diseases: Secondary | ICD-10-CM | POA: Diagnosis not present

## 2014-11-04 DIAGNOSIS — Z23 Encounter for immunization: Secondary | ICD-10-CM | POA: Diagnosis not present

## 2014-11-04 MED ORDER — TETANUS-DIPHTH-ACELL PERTUSSIS 5-2.5-18.5 LF-MCG/0.5 IM SUSP
0.5000 mL | Freq: Once | INTRAMUSCULAR | Status: AC
  Administered 2014-11-04: 0.5 mL via INTRAMUSCULAR
  Filled 2014-11-04: qty 0.5
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
## 2014-11-04 NOTE — ED Notes (Signed)
Pt nasal area cleaned with sure cleanse, pt tolerated well,
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
## 2014-11-05 MED ORDER — IBUPROFEN 800 MG PO TABS: 800.0000 mg | ORAL_TABLET | Freq: Three times a day (TID) | ORAL | Status: DC

## 2014-11-05 MED ORDER — IBUPROFEN 800 MG PO TABS
800.0000 mg | ORAL_TABLET | Freq: Once | ORAL | Status: AC
  Administered 2014-11-05: 800 mg via ORAL
  Filled 2014-11-05: qty 1

## 2014-11-05 NOTE — ED Notes (Signed)
[facility] AT BEDSIDE FOR REPORT,

## 2014-11-05 NOTE — Discharge Instructions (Signed)
Facial or Scalp Contusion ° A facial or scalp contusion is a deep bruise on the face or head. Contusions happen when an injury causes bleeding under the skin. Signs of bruising include pain, puffiness (swelling), and discolored skin. The contusion may turn blue, purple, or yellow. °HOME CARE °· Only take medicines as told by your doctor. °· Put ice on the injured area. °¨ Put ice in a plastic bag. °¨ Place a towel between your skin and the bag. °¨ Leave the ice on for 20 minutes, 2-3 times a day. °GET HELP IF: °· You have bite problems. °· You have pain when chewing. °· You are worried about your face not healing normally. °GET HELP RIGHT AWAY IF:  °· You have severe pain or a headache and medicine does not help. °· You are very tired or confused, or your personality changes. °· You throw up (vomit). °· You have a nosebleed that will not stop. °· You see two of everything (double vision) or have blurry vision. °· You have fluid coming from your nose or ear. °· You have problems walking or using your arms or legs. °MAKE SURE YOU:  °· Understand these instructions. °· Will watch your condition. °· Will get help right away if you are not doing well or get worse. °  °This information is not intended to replace advice given to you by your health care provider. Make sure you discuss any questions you have with your health care provider. °  °Document Released: 12/10/2010 Document Revised: 01/11/2014 Document Reviewed: 08/03/2012 °Elsevier Interactive Patient Education ©2016 Elsevier Inc. ° °

## 2014-11-15 ENCOUNTER — Encounter: Payer: Self-pay | Admitting: Adult Health

## 2014-11-15 ENCOUNTER — Ambulatory Visit (INDEPENDENT_AMBULATORY_CARE_PROVIDER_SITE_OTHER): Payer: Medicaid Other | Admitting: Adult Health

## 2014-11-15 ENCOUNTER — Other Ambulatory Visit (HOSPITAL_COMMUNITY)
Admission: RE | Admit: 2014-11-15 | Discharge: 2014-11-15 | Disposition: A | Discharge status: Home / Self Care | Payer: Medicaid Other | Source: Ambulatory Visit | Attending: Adult Health | Admitting: Adult Health

## 2014-11-15 VITALS — BP 110/80 | HR 68 | Ht 63.5 in | Wt 146.0 lb

## 2014-11-15 DIAGNOSIS — Z3042 Encounter for surveillance of injectable contraceptive: Secondary | ICD-10-CM

## 2014-11-15 DIAGNOSIS — Z1151 Encounter for screening for human papillomavirus (HPV): Secondary | ICD-10-CM | POA: Diagnosis not present

## 2014-11-15 DIAGNOSIS — R109 Unspecified abdominal pain: Secondary | ICD-10-CM

## 2014-11-15 DIAGNOSIS — R319 Hematuria, unspecified: Secondary | ICD-10-CM

## 2014-11-15 DIAGNOSIS — B977 Papillomavirus as the cause of diseases classified elsewhere: Secondary | ICD-10-CM

## 2014-11-15 DIAGNOSIS — N921 Excessive and frequent menstruation with irregular cycle: Secondary | ICD-10-CM

## 2014-11-15 DIAGNOSIS — Z113 Encounter for screening for infections with a predominantly sexual mode of transmission: Secondary | ICD-10-CM | POA: Diagnosis present

## 2014-11-15 DIAGNOSIS — Z01411 Encounter for gynecological examination (general) (routine) with abnormal findings: Secondary | ICD-10-CM | POA: Diagnosis present

## 2014-11-15 DIAGNOSIS — Z309 Encounter for contraceptive management, unspecified: Secondary | ICD-10-CM

## 2014-11-15 DIAGNOSIS — R309 Painful micturition, unspecified: Secondary | ICD-10-CM

## 2014-11-15 DIAGNOSIS — Z01419 Encounter for gynecological examination (general) (routine) without abnormal findings: Secondary | ICD-10-CM

## 2014-11-15 HISTORY — DX: Painful micturition, unspecified: R30.9

## 2014-11-15 HISTORY — DX: Excessive and frequent menstruation with irregular cycle: N92.1

## 2014-11-15 HISTORY — DX: Unspecified abdominal pain: R10.9

## 2014-11-15 LAB — POCT URINALYSIS DIPSTICK
Glucose, UA: NEGATIVE
KETONES UA: NEGATIVE
NITRITE UA: NEGATIVE
Protein, UA: NEGATIVE

## 2014-11-15 MED ORDER — SULFAMETHOXAZOLE-TRIMETHOPRIM 800-160 MG PO TABS: 1.0000 | ORAL_TABLET | Freq: Two times a day (BID) | ORAL | Status: DC

## 2014-11-15 MED ORDER — MEDROXYPROGESTERONE ACETATE 150 MG/ML IM SUSP: 150.0000 mg | INTRAMUSCULAR | Status: DC

## 2014-11-15 NOTE — Progress Notes (Signed)
Patient ID: ROMONA GRZYWACZ, female   DOB: 1990-12-02, 24 y.o.   MRN: [id number] History of Present Illness: Katheleen is a 24 year old black female,G1P1 in for well woman gyn exam and pap,she had ASCUS with +HPV last year.She is complaining of pain with urination and low abdominal pain and some low back pain and BTB on depo and sex hurts at times.   Current Medications, Allergies, Past Medical History, Past Surgical History, Family History and Social History were reviewed in [employer] record.     Review of Systems: Patient denies any headaches, hearing loss, fatigue, blurred vision, shortness of breath, chest pain,  problems with bowel movements. No joint pain or mood swings. See HPI for positives.   Physical Exam:BP 110/80 mmHg  Pulse 68  Ht 5' 3.5" (1.613 m)  Wt 146 lb (66.225 kg)  BMI 25.45 kg/m2 urine 3+ blood ,small leuks General:  Well developed, well nourished, no acute distress Skin:  Warm and dry Neck:  Midline trachea, normal thyroid, good ROM, no lymphadenopathy Lungs; Clear to auscultation bilaterally Breast:  No dominant palpable mass, retraction, or nipple discharge Cardiovascular: Regular rate and rhythm Abdomen:  Soft, non tender, no hepatosplenomegaly Pelvic:  External genitalia is normal in appearance, no lesions.  The vagina is normal in appearance,with light pink discharge, Urethra has no lesions or masses. The cervix is bulbous,pap with HPV and GC/CHL performed.  Uterus is felt to be normal size, shape, and contour.  No adnexal masses or tenderness noted.Bladder is mildly tender, no masses felt. Extremities/musculoskeletal:  No swelling or varicosities noted, no clubbing or cyanosis Psych:  No mood changes, alert and cooperative,seems happy   Impression: Well woman gyn exam with pap family planning medicaid Pain with urination BTB  Abdominal pain Contraceptive management-depo Hematuria  +HPV  STD screening    Plan: HIV,RPR and  HSV 2  Rx septra ds 1 bid x 7 days Push water Refilled depo provera 150 mg #1 vial with 4 refills, for IM injection every 12 weeks in office Physical in  1 year Next depo 12/12

## 2014-11-15 NOTE — Patient Instructions (Signed)
Urinary Tract Infection Urinary tract infections (UTIs) can develop anywhere along your urinary tract. Your urinary tract is your body's drainage system for removing wastes and extra water. Your urinary tract includes two kidneys, two ureters, a bladder, and a urethra. Your kidneys are a pair of bean-shaped organs. Each kidney is about the size of your fist. They are located below your ribs, one on each side of your spine. CAUSES Infections are caused by microbes, which are microscopic organisms, including fungi, viruses, and bacteria. These organisms are so small that they can only be seen through a microscope. Bacteria are the microbes that most commonly cause UTIs. SYMPTOMS  Symptoms of UTIs may vary by age and gender of the patient and by the location of the infection. Symptoms in young women typically include a frequent and intense urge to urinate and a painful, burning feeling in the bladder or urethra during urination. Older women and men are more likely to be tired, shaky, and weak and have muscle aches and abdominal pain. A fever may mean the infection is in your kidneys. Other symptoms of a kidney infection include pain in your back or sides below the ribs, nausea, and vomiting. DIAGNOSIS To diagnose a UTI, your caregiver will ask you about your symptoms. Your caregiver will also ask you to provide a urine sample. The urine sample will be tested for bacteria and white blood cells. White blood cells are made by your body to help fight infection. TREATMENT  Typically, UTIs can be treated with medication. Because most UTIs are caused by a bacterial infection, they usually can be treated with the use of antibiotics. The choice of antibiotic and length of treatment depend on your symptoms and the type of bacteria causing your infection. HOME CARE INSTRUCTIONS  If you were prescribed antibiotics, take them exactly as your caregiver instructs you. Finish the medication even if you feel better after  you have only taken some of the medication.  Drink enough water and fluids to keep your urine clear or pale yellow.  Avoid caffeine, tea, and carbonated beverages. They tend to irritate your bladder.  Empty your bladder often. Avoid holding urine for long periods of time.  Empty your bladder before and after sexual intercourse.  After a bowel movement, women should cleanse from front to back. Use each tissue only once. SEEK MEDICAL CARE IF:   You have back pain.  You develop a fever.  Your symptoms do not begin to resolve within 3 days. SEEK IMMEDIATE MEDICAL CARE IF:   You have severe back pain or lower abdominal pain.  You develop chills.  You have nausea or vomiting.  You have continued burning or discomfort with urination. MAKE SURE YOU:   Understand these instructions.  Will watch your condition.  Will get help right away if you are not doing well or get worse.   This information is not intended to replace advice given to you by your health care provider. Make sure you discuss any questions you have with your health care provider.   Document Released: 09/30/2004 Document Revised: 09/11/2014 Document Reviewed: 01/29/2011 Elsevier Interactive Patient Education 2016 [place] Take septra ds 1 bid x 7 days

## 2014-11-16 LAB — RPR: RPR Ser Ql: NONREACTIVE

## 2014-11-16 LAB — HSV 2 ANTIBODY, IGG

## 2014-11-16 LAB — HIV ANTIBODY (ROUTINE TESTING W REFLEX): HIV Screen 4th Generation wRfx: NONREACTIVE

## 2014-11-19 LAB — CYTOLOGY - PAP

## 2014-11-20 ENCOUNTER — Telehealth: Payer: Self-pay | Admitting: Adult Health

## 2014-11-20 NOTE — Telephone Encounter (Signed)
Voice mail not set up @ 10:28 am. [employer]

## 2014-11-20 NOTE — Telephone Encounter (Signed)
Pt aware labs negative and pap +HPV repeat pap in 1 year

## 2014-11-20 NOTE — Telephone Encounter (Signed)
Pt called stating that she is calling regarding her blood results. Please contact pt

## 2014-12-23 ENCOUNTER — Ambulatory Visit: Payer: Medicaid Other

## 2014-12-23 ENCOUNTER — Encounter: Payer: Self-pay | Admitting: *Deleted

## 2015-06-13 ENCOUNTER — Emergency Department (HOSPITAL_COMMUNITY)
Admission: EM | Admit: 2015-06-13 | Discharge: 2015-06-13 | Disposition: A | Discharge status: Home / Self Care | Payer: Self-pay | Attending: Emergency Medicine | Admitting: Emergency Medicine

## 2015-06-13 ENCOUNTER — Encounter (HOSPITAL_COMMUNITY): Payer: Self-pay

## 2015-06-13 ENCOUNTER — Emergency Department (HOSPITAL_COMMUNITY): Payer: Self-pay

## 2015-06-13 DIAGNOSIS — B349 Viral infection, unspecified: Secondary | ICD-10-CM | POA: Insufficient documentation

## 2015-06-13 DIAGNOSIS — Z791 Long term (current) use of non-steroidal anti-inflammatories (NSAID): Secondary | ICD-10-CM | POA: Insufficient documentation

## 2015-06-13 LAB — COMPREHENSIVE METABOLIC PANEL
ALK PHOS: 62 U/L (ref 38–126)
ALT: 25 U/L (ref 14–54)
AST: 27 U/L (ref 15–41)
Albumin: 4.7 g/dL (ref 3.5–5.0)
Anion gap: 9 (ref 5–15)
BUN: <5 mg/dL — ABNORMAL LOW (ref 6–20)
CALCIUM: 9.5 mg/dL (ref 8.9–10.3)
CO2: 26 mmol/L (ref 22–32)
CREATININE: 0.78 mg/dL (ref 0.44–1.00)
Chloride: 98 mmol/L — ABNORMAL LOW (ref 101–111)
Glucose, Bld: 96 mg/dL (ref 65–99)
Potassium: 3.1 mmol/L — ABNORMAL LOW (ref 3.5–5.1)
Sodium: 133 mmol/L — ABNORMAL LOW (ref 135–145)
Total Bilirubin: 0.5 mg/dL (ref 0.3–1.2)
Total Protein: 8.4 g/dL — ABNORMAL HIGH (ref 6.5–8.1)

## 2015-06-13 LAB — CBC
HCT: 41.4 % (ref 36.0–46.0)
Hemoglobin: 13.9 g/dL (ref 12.0–15.0)
MCH: 28 pg (ref 26.0–34.0)
MCHC: 33.6 g/dL (ref 30.0–36.0)
MCV: 83.5 fL (ref 78.0–100.0)
PLATELETS: 291 10*3/uL (ref 150–400)
RBC: 4.96 MIL/uL (ref 3.87–5.11)
RDW: 12.7 % (ref 11.5–15.5)
WBC: 11.7 10*3/uL — AB (ref 4.0–10.5)

## 2015-06-13 LAB — URINALYSIS, ROUTINE W REFLEX MICROSCOPIC
Bilirubin Urine: NEGATIVE
Glucose, UA: NEGATIVE mg/dL
KETONES UR: NEGATIVE mg/dL
LEUKOCYTES UA: NEGATIVE
Nitrite: NEGATIVE
PROTEIN: NEGATIVE mg/dL
Specific Gravity, Urine: <1.005 — ABNORMAL LOW (ref 1.005–1.030)
pH: 6 (ref 5.0–8.0)

## 2015-06-13 LAB — PREGNANCY, URINE: PREG TEST UR: NEGATIVE

## 2015-06-13 LAB — LIPASE, BLOOD: Lipase: 19 U/L (ref 11–51)

## 2015-06-13 LAB — URINE MICROSCOPIC-ADD ON

## 2015-06-13 MED ORDER — SODIUM CHLORIDE 0.9 % IV BOLUS (SEPSIS)
1000.0000 mL | Freq: Once | INTRAVENOUS | Status: AC
  Administered 2015-06-13: 1000 mL via INTRAVENOUS

## 2015-06-13 MED ORDER — ONDANSETRON HCL 4 MG/2ML IJ SOLN
4.0000 mg | Freq: Once | INTRAMUSCULAR | Status: AC
  Administered 2015-06-13: 4 mg via INTRAVENOUS
  Filled 2015-06-13: qty 2

## 2015-06-13 MED ORDER — PROMETHAZINE HCL 25 MG PO TABS: 25.0000 mg | ORAL_TABLET | Freq: Four times a day (QID) | ORAL | Status: DC | PRN

## 2015-06-13 MED ORDER — ACETAMINOPHEN 500 MG PO TABS
1000.0000 mg | ORAL_TABLET | Freq: Once | ORAL | Status: AC
  Administered 2015-06-13: 1000 mg via ORAL
  Filled 2015-06-13: qty 2

## 2015-06-13 NOTE — ED Notes (Addendum)
Pt c/o headache and is requesting medication. Dr. Lacinda Axon notified. Verbal order for Tylenol given.

## 2015-06-13 NOTE — Discharge Instructions (Signed)
Prescription for nausea medication. Increase fluids. Tylenol for fever. Rest

## 2015-06-13 NOTE — ED Notes (Signed)
Multiple complaints since yesterday at 8 am.  C/o vomiting,  Swimmy headed and sob . States she think she is sick from the mold.

## 2015-06-13 NOTE — ED Provider Notes (Signed)
CSN: [id number]     Arrival date & time 06/13/15  1400 History   First MD Initiated Contact with Patient 06/13/15 1439     Chief Complaint  Patient presents with  . Emesis     (Consider location/radiation/quality/duration/timing/severity/associated sxs/prior Treatment) HPI.....Marland KitchenMarland KitchenFever, chills, nausea, vomiting, diarrhea, cough for 36 hours. She has had pneumonia in early 2017. She has mold in her home is concerned about same. Low-grade fever. She has been drinking fluids moderately.  Severity of symptoms is moderate.  Nothing makes symptoms better or worse  Past Medical History  Diagnosis Date  . No pertinent past medical history   . Sickle cell trait ([place])   . Contraception management 04/13/2012  . Urinary frequency 10/17/2012  . Chlamydia   . History of chlamydia 06/06/2013  . Rib pain on left side 07/23/2014  . Cough 07/23/2014  . Pain with urination 11/15/2014  . Abdominal pain 11/15/2014  . Irregular intermenstrual bleeding 11/15/2014   Past Surgical History  Procedure Laterality Date  . No past surgeries     Family History  Problem Relation Age of Onset  . Adopted: Yes  . Hypertension Mother   . Cancer Maternal Grandmother     breast   . Diabetes Maternal Grandmother    Social History  Substance Use Topics  . Smoking status: Never Smoker   . Smokeless tobacco: Never Used  . Alcohol Use: Yes   OB History    Gravida Para Term Preterm AB TAB SAB Ectopic Multiple Living   1 1 1  0 0 0 0 0 0 1     Review of Systems  All other systems reviewed and are negative.     Allergies  Bee venom; Azithromycin; and Levofloxacin  Home Medications   Prior to Admission medications   Medication Sig Start Date End Date Taking? Authorizing Provider  ibuprofen (ADVIL,MOTRIN) 200 MG tablet Take 400 mg by mouth every 6 (six) hours as needed for moderate pain.   Yes Historical Provider, MD  medroxyPROGESTERone (DEPO-PROVERA) 150 MG/ML injection Inject 1 mL (150 mg total) into the  muscle every 3 (three) months. Patient not taking: Reported on 06/13/2015 11/15/14   Estill Dooms, NP  promethazine (PHENERGAN) 25 MG tablet Take 1 tablet (25 mg total) by mouth every 6 (six) hours as needed. 06/13/15   Nat Christen, MD   BP 105/81 mmHg  Pulse 87  Temp(Src) 100.7 F (38.2 C) (Oral)  Resp 18  Ht 5\' 4"  (1.626 m)  Wt 147 lb 6.4 oz (66.86 kg)  BMI 25.29 kg/m2  SpO2 99%  LMP 05/21/2015 Physical Exam  Constitutional: She is oriented to person, place, and time.  Nontoxic appearing  HENT:  Head: Normocephalic and atraumatic.  Eyes: Conjunctivae and EOM are normal. Pupils are equal, round, and reactive to light.  Neck: Normal range of motion. Neck supple.  Cardiovascular: Normal rate and regular rhythm.   Pulmonary/Chest: Effort normal and breath sounds normal.  Abdominal: Soft. Bowel sounds are normal.  Musculoskeletal: Normal range of motion.  Neurological: She is alert and oriented to person, place, and time.  Skin: Skin is warm and dry.  Psychiatric: She has a normal mood and affect. Her behavior is normal.  Nursing note and vitals reviewed.   ED Course  Procedures (including critical care time) Labs Review Labs Reviewed  COMPREHENSIVE METABOLIC PANEL - Abnormal; Notable for the following:    Sodium 133 (*)    Potassium 3.1 (*)    Chloride 98 (*)  BUN <5 (*)    Total Protein 8.4 (*)    All other components within normal limits  CBC - Abnormal; Notable for the following:    WBC 11.7 (*)    All other components within normal limits  URINALYSIS, ROUTINE W REFLEX MICROSCOPIC (NOT AT [facility]) - Abnormal; Notable for the following:    Specific Gravity, Urine <1.005 (*)    Hgb urine dipstick SMALL (*)    All other components within normal limits  URINE MICROSCOPIC-ADD ON - Abnormal; Notable for the following:    Squamous Epithelial / LPF 6-30 (*)    Bacteria, UA RARE (*)    All other components within normal limits  LIPASE, BLOOD  PREGNANCY, URINE     Imaging Review Dg Chest 2 View  06/13/2015  CLINICAL DATA:  Cough and he either. Sore throat and vomiting. Sickle cell trait. EXAM: CHEST  2 VIEW COMPARISON:  07/24/2014 FINDINGS: The cardiac silhouette, mediastinal and hilar contours are normal and stable. The lungs are clear. No pleural effusion. The bony thorax is intact. IMPRESSION: No acute cardiopulmonary findings. Electronically Signed   By: Marijo Sanes M.D.   On: 06/13/2015 16:26   I have personally reviewed and evaluated these images and lab results as part of my medical decision-making.   EKG Interpretation None      MDM   Final diagnoses:  Viral syndrome    Patient feels better after IV fluids. Potassium slightly subtherapeutic. Chest x-ray negative for acute findings. Suspect viral syndrome. Patient is hemodynamically stable at discharge.    Nat Christen, MD 06/13/15 2056

## 2015-07-03 ENCOUNTER — Encounter (HOSPITAL_COMMUNITY): Payer: Self-pay | Admitting: Emergency Medicine

## 2015-07-03 ENCOUNTER — Emergency Department (HOSPITAL_COMMUNITY)
Admission: EM | Admit: 2015-07-03 | Discharge: 2015-07-03 | Disposition: A | Discharge status: Home / Self Care | Payer: Self-pay | Attending: Emergency Medicine | Admitting: Emergency Medicine

## 2015-07-03 DIAGNOSIS — T65891A Toxic effect of other specified substances, accidental (unintentional), initial encounter: Secondary | ICD-10-CM | POA: Insufficient documentation

## 2015-07-03 DIAGNOSIS — H10213 Acute toxic conjunctivitis, bilateral: Secondary | ICD-10-CM | POA: Insufficient documentation

## 2015-07-03 MED ORDER — TETRACAINE HCL 0.5 % OP SOLN
2.0000 [drp] | Freq: Once | OPHTHALMIC | Status: AC
  Administered 2015-07-03: 2 [drp] via OPHTHALMIC
  Filled 2015-07-03: qty 4

## 2015-07-03 MED ORDER — PROPARACAINE HCL 0.5 % OP SOLN
2.0000 [drp] | Freq: Once | OPHTHALMIC | Status: DC
  Filled 2015-07-03: qty 15

## 2015-07-03 NOTE — Discharge Instructions (Signed)
Follow up with dr. Iona Hansen if problems.  You may use the numbing drops 2 more times today if needed.  Do not use them after today
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
## 2015-07-03 NOTE — ED Notes (Signed)
[employer] called to send officer here to talk with patient per patient request.
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
## 2015-09-10 IMAGING — DX DG RIBS 2V*L*
3 series · 3 of 3 positions shown · non-contrast
Comparison: None.

CLINICAL DATA: Left-sided rib pain

EXAM:
LEFT RIBS - 2 VIEW

[rib ap (1 of 2)]
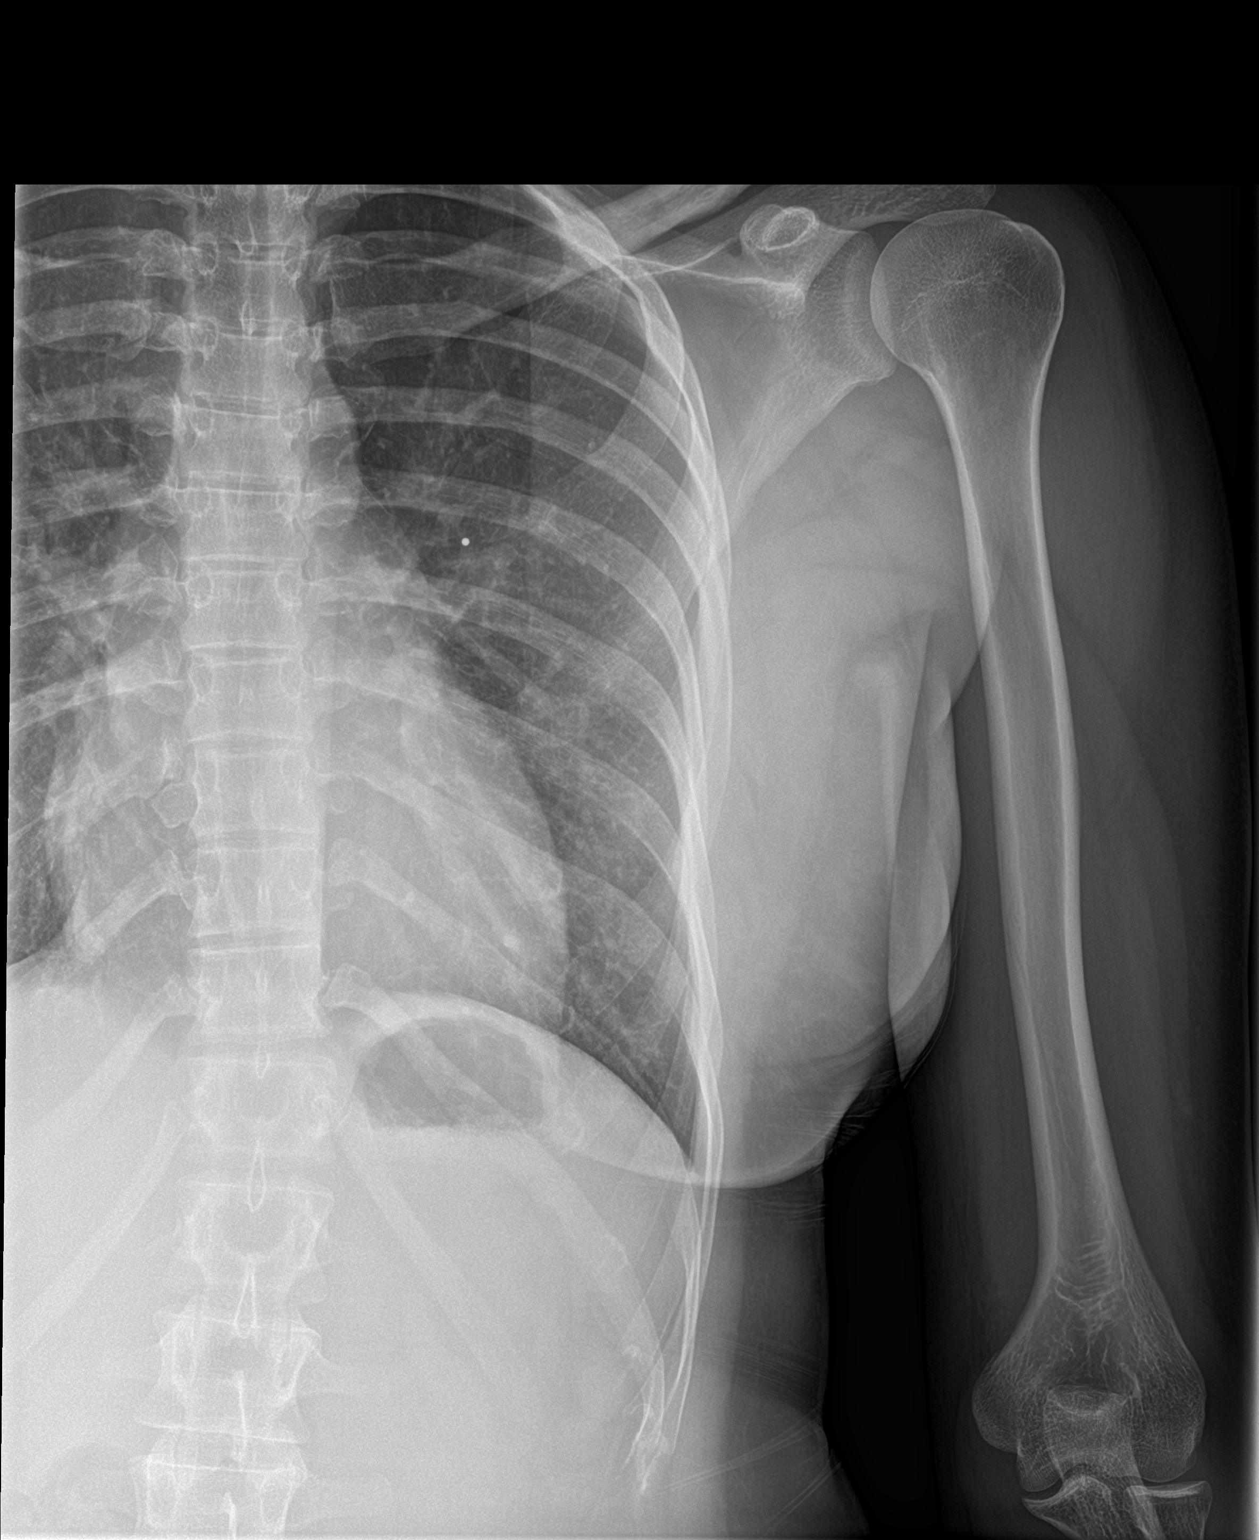

[rib ap (2 of 2)]
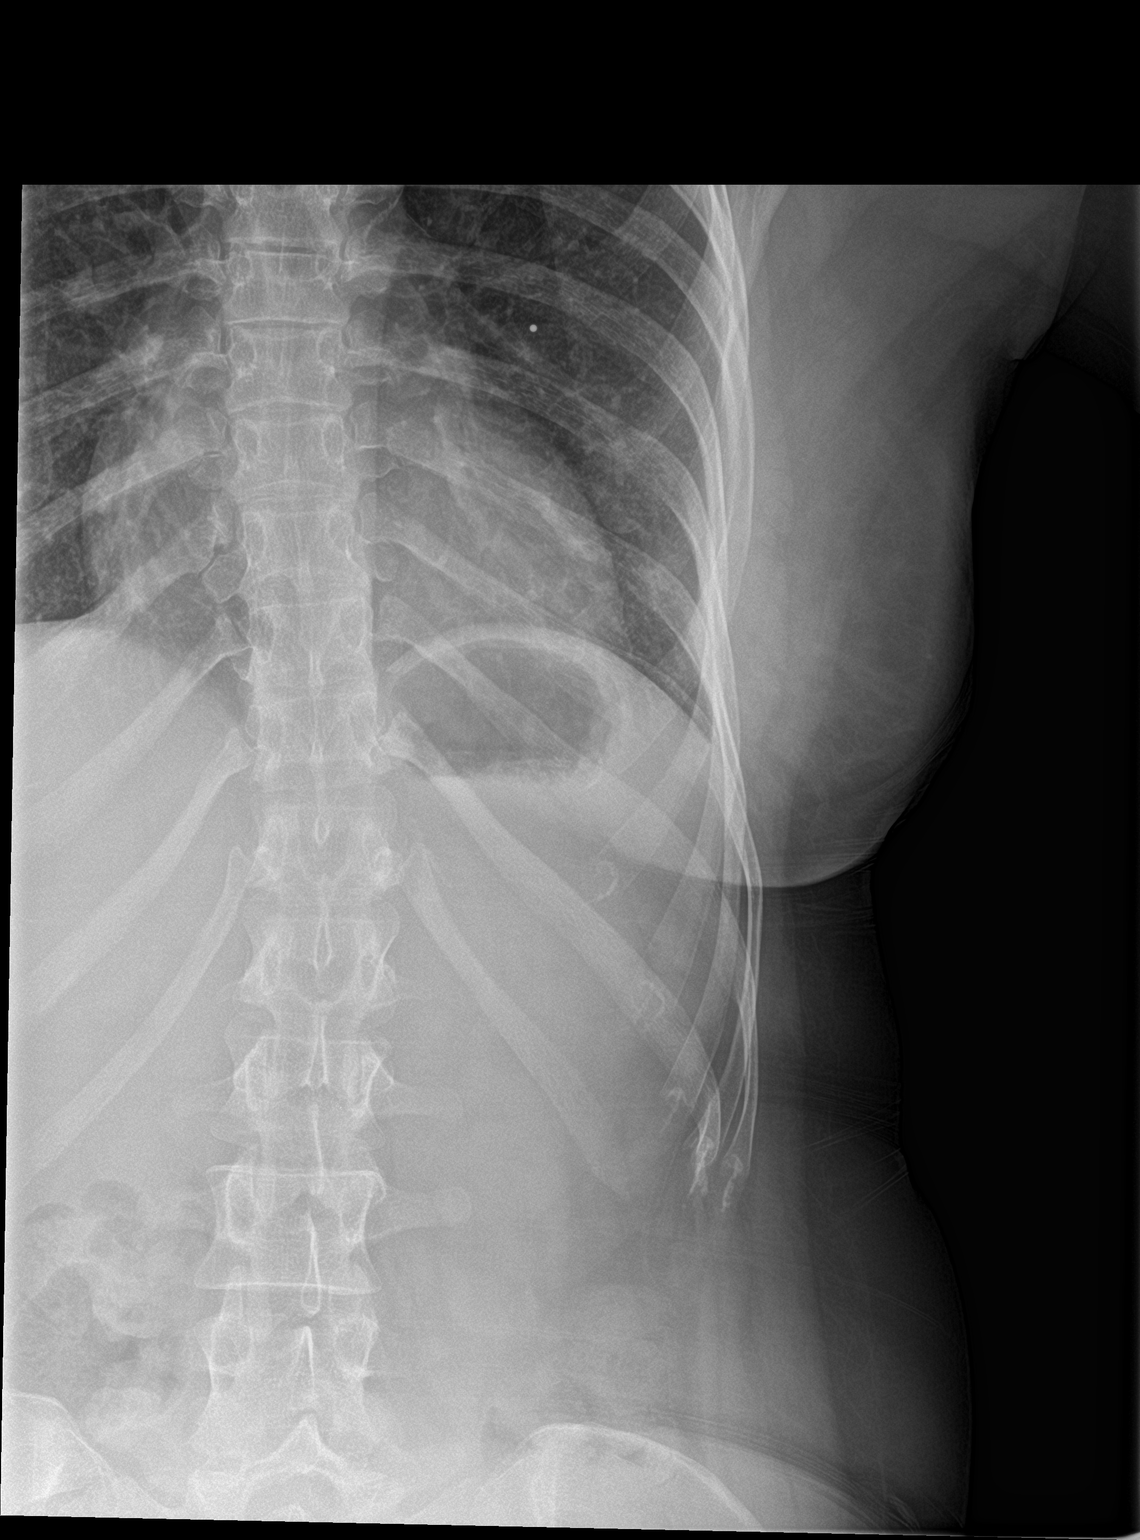

[rib ap obl]
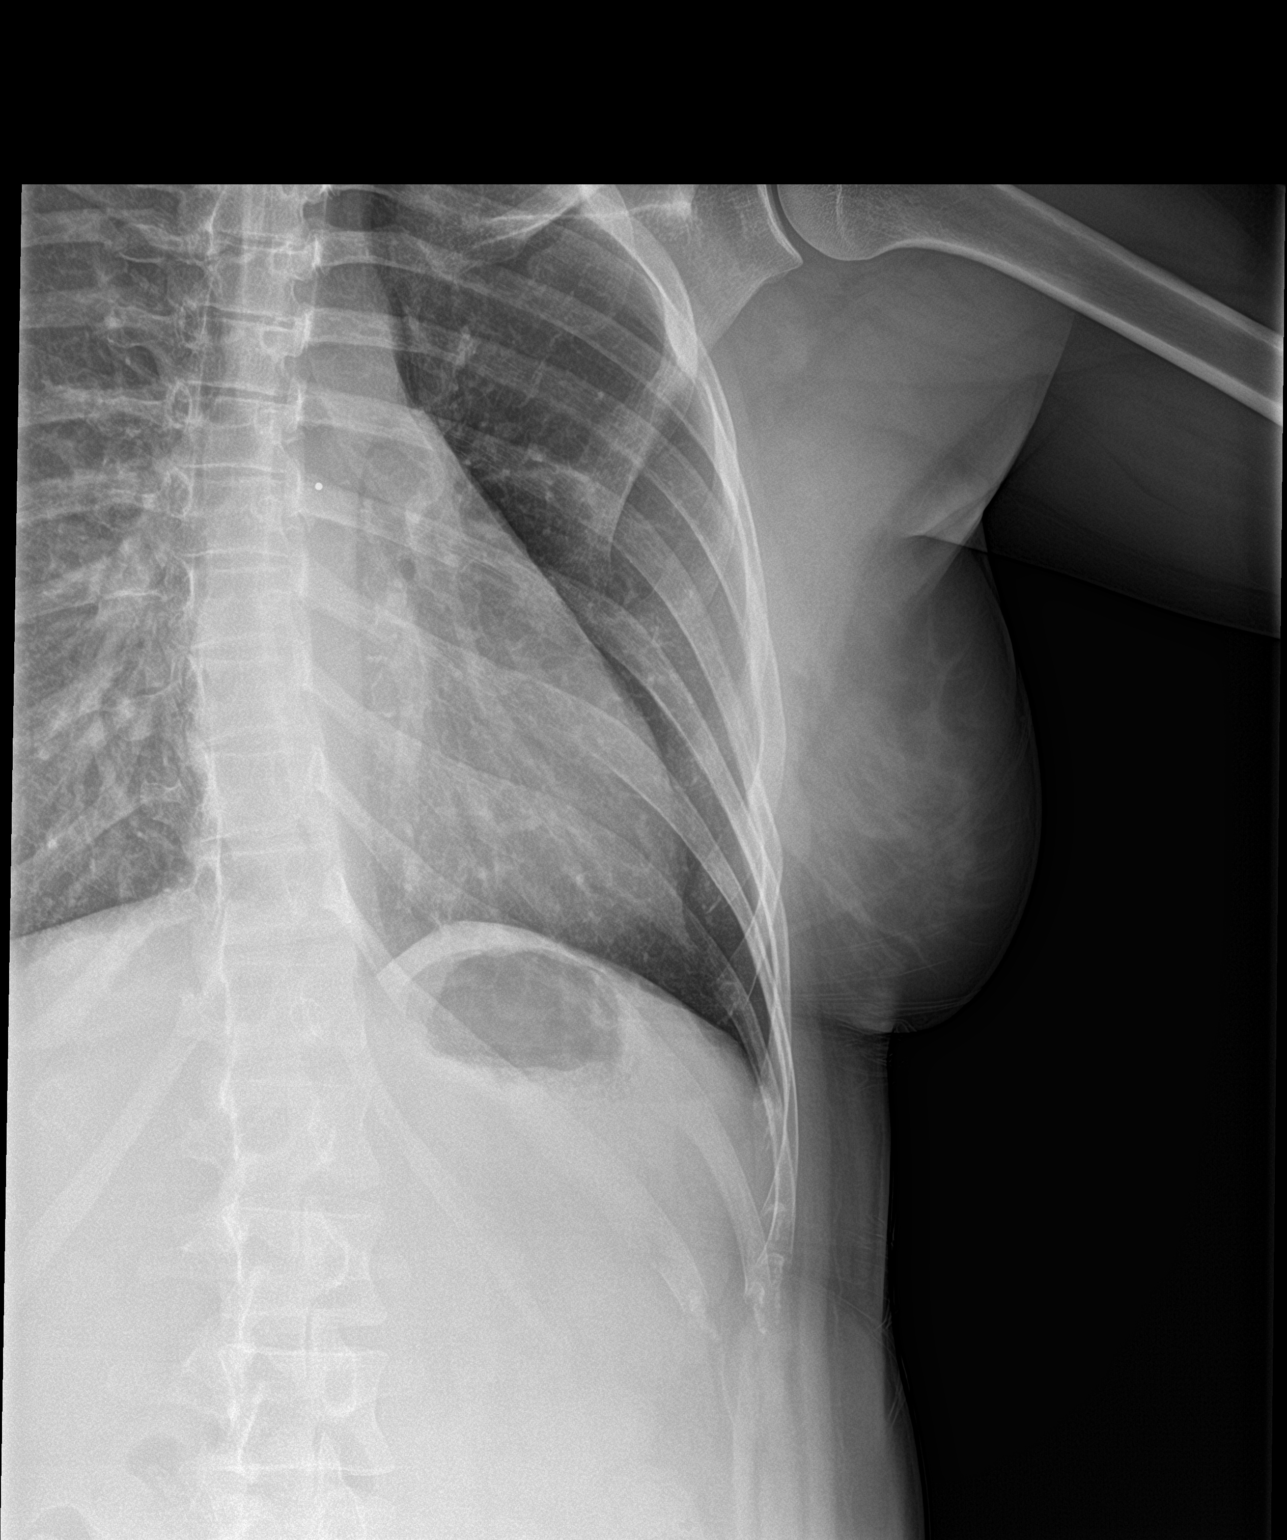

[3 of 3 positions shown; findings below may reference images not displayed]

FINDINGS: No fracture or other bone lesions are seen involving the ribs.
IMPRESSION: Negative.

## 2015-09-11 IMAGING — DX DG CHEST 2V
2 series · 2 of 2 positions shown · non-contrast
Comparison: Chest x-ray dated 07/23/2014.

CLINICAL DATA: Worsening left-sided chest pain, cough and
congestion for 2 weeks.

EXAM:
CHEST  2 VIEW

[chest pa]
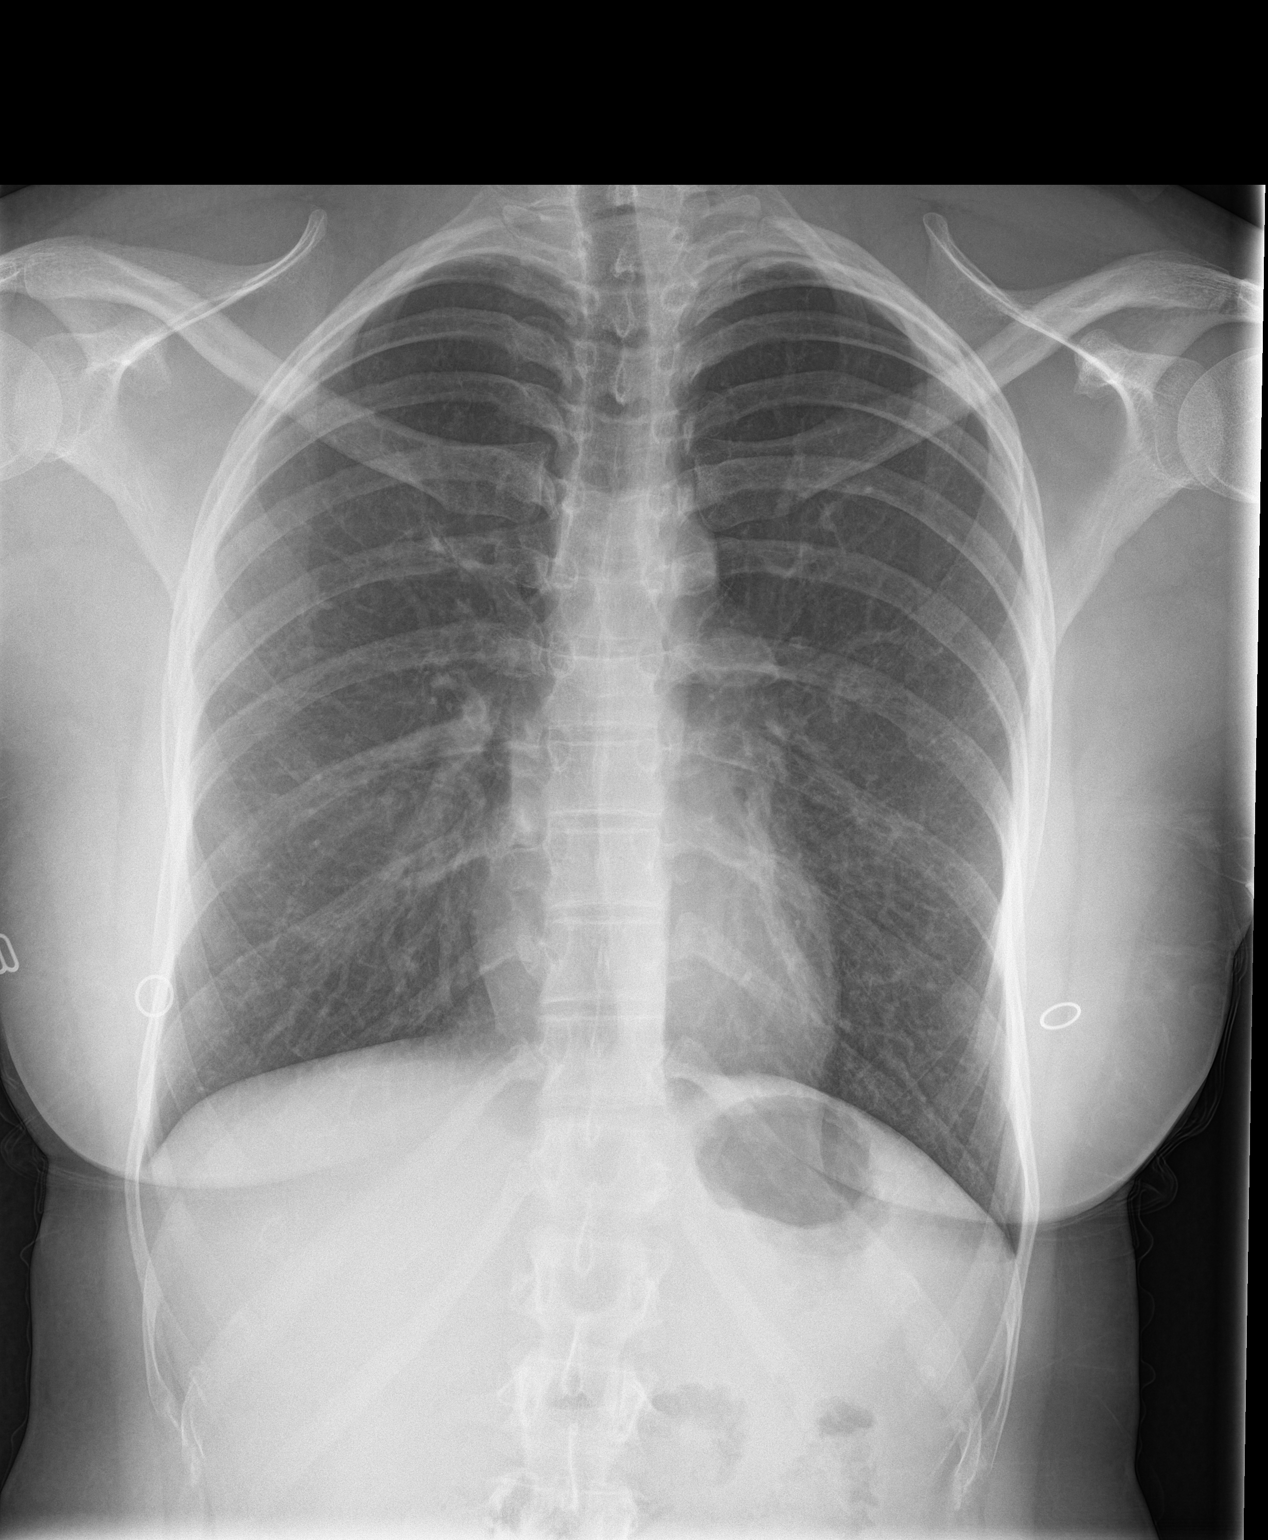

[chest lat]
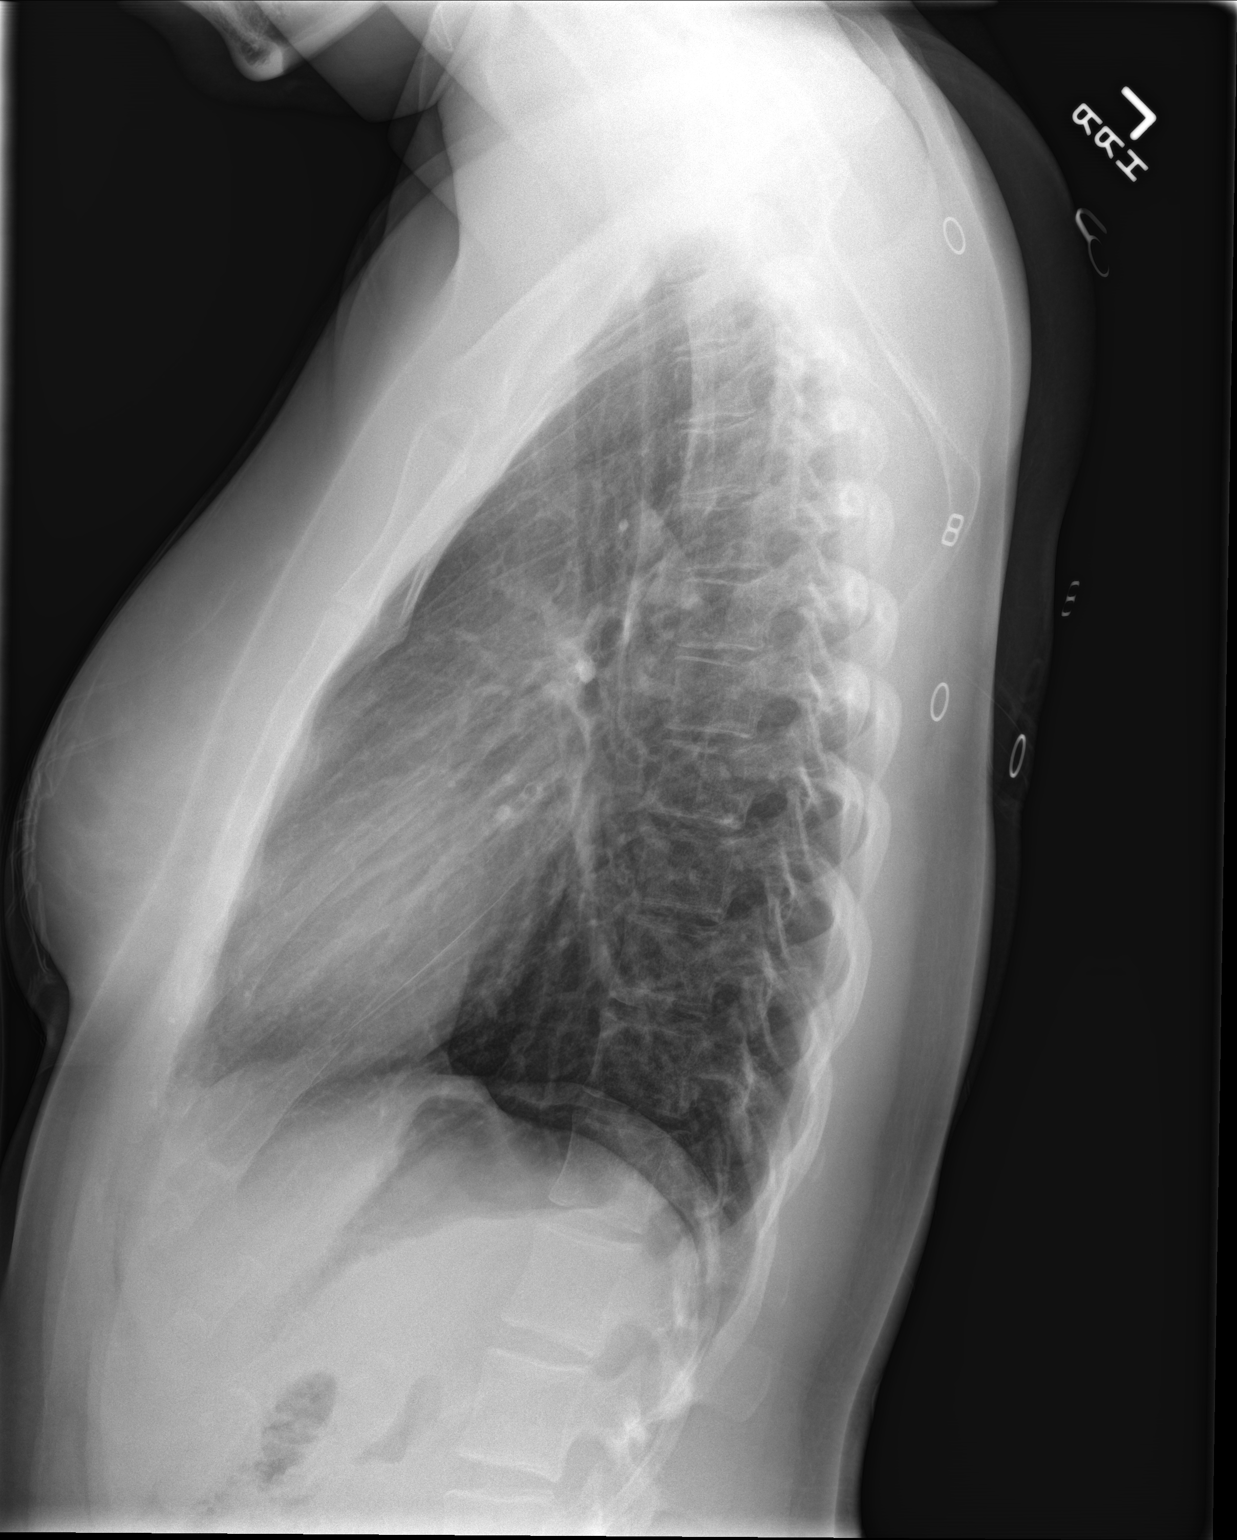

[2 of 2 positions shown; findings below may reference images not displayed]

FINDINGS: The left perihilar opacity identified on yesterday's chest x-ray is
less apparent on today's study. Remainder of the lungs are clear. No
pleural effusion. No pneumothorax.

Cardiomediastinal silhouette remains normal in size and
configuration. No osseous abnormality identified.
IMPRESSION: The left perihilar opacity identified on yesterday's chest x-ray as
suspicious for a perihilar pneumonia is less prominent on today's
study, but a streaky opacity does persist over the left heart border
which again could represent pneumonia.

No new lung findings. There has certainly been no worsening of the
previous findings.

## 2015-11-06 ENCOUNTER — Emergency Department (HOSPITAL_COMMUNITY): Payer: Self-pay

## 2015-11-06 ENCOUNTER — Emergency Department (HOSPITAL_COMMUNITY)
Admission: EM | Admit: 2015-11-06 | Discharge: 2015-11-06 | Disposition: A | Discharge status: Home / Self Care | Payer: Self-pay | Attending: Emergency Medicine | Admitting: Emergency Medicine

## 2015-11-06 ENCOUNTER — Encounter (HOSPITAL_COMMUNITY): Payer: Self-pay | Admitting: Emergency Medicine

## 2015-11-06 DIAGNOSIS — Y999 Unspecified external cause status: Secondary | ICD-10-CM | POA: Insufficient documentation

## 2015-11-06 DIAGNOSIS — S62514A Nondisplaced fracture of proximal phalanx of right thumb, initial encounter for closed fracture: Secondary | ICD-10-CM | POA: Insufficient documentation

## 2015-11-06 DIAGNOSIS — Y929 Unspecified place or not applicable: Secondary | ICD-10-CM | POA: Insufficient documentation

## 2015-11-06 DIAGNOSIS — Z79899 Other long term (current) drug therapy: Secondary | ICD-10-CM | POA: Insufficient documentation

## 2015-11-06 DIAGNOSIS — S62524A Nondisplaced fracture of distal phalanx of right thumb, initial encounter for closed fracture: Secondary | ICD-10-CM

## 2015-11-06 DIAGNOSIS — W268XXA Contact with other sharp object(s), not elsewhere classified, initial encounter: Secondary | ICD-10-CM | POA: Insufficient documentation

## 2015-11-06 DIAGNOSIS — Y939 Activity, unspecified: Secondary | ICD-10-CM | POA: Insufficient documentation

## 2015-11-06 MED ORDER — HYDROCODONE-ACETAMINOPHEN 5-325 MG PO TABS
1.0000 | ORAL_TABLET | Freq: Once | ORAL | Status: AC
  Administered 2015-11-06: 1 via ORAL
  Filled 2015-11-06: qty 1

## 2015-11-06 MED ORDER — HYDROCODONE-ACETAMINOPHEN 5-325 MG PO TABS: 1.0000 | ORAL_TABLET | ORAL | 0 refills | Status: DC | PRN

## 2015-11-06 NOTE — ED Provider Notes (Signed)
[facility] DEPT Provider Note   CSN: [id number] Arrival date & time: 11/06/15  1847  By signing my name below, I, Deanna Sheppard, attest that this documentation has been prepared under the direction and in the presence of physician practitioner, Isla Pence, MD. Electronically Signed: Dora Sheppard, Scribe. 11/06/2015. 8:02 PM.  History   Chief Complaint Chief Complaint  Patient presents with  . Hand Pain    The history is provided by the patient. No language interpreter was used.     HPI Comments: Deanna Sheppard is a 25 y.o. female who presents to the Emergency Department complaining of sudden onset, constant, right thumb pain s/p striking it against a metal surface at work about 2 hours ago. Pt reports she works at [employer] and moves her arms and hands frequently. She states she can barely move her right thumb due to pain. She notes associated swelling. She denies numbness, weakness, color change, wounds, or any other associated symptoms.  Past Medical History:  Diagnosis Date  . Abdominal pain 11/15/2014  . Chlamydia   . Contraception management 04/13/2012  . Cough 07/23/2014  . History of chlamydia 06/06/2013  . Irregular intermenstrual bleeding 11/15/2014  . No pertinent past medical history   . Pain with urination 11/15/2014  . Rib pain on left side 07/23/2014  . Sickle cell trait ([place])   . Urinary frequency 10/17/2012    Patient Active Problem List   Diagnosis Date Noted  . Pain with urination 11/15/2014  . Abdominal pain 11/15/2014  . Irregular intermenstrual bleeding 11/15/2014  . Rib pain on left side 07/23/2014  . Cough 07/23/2014  . History of chlamydia 06/06/2013  . Urinary frequency 10/17/2012  . Contraception management 04/13/2012  . Sickle cell trait ([place]) 04/12/2012  . NSVD (normal spontaneous vaginal delivery) 06/04/2011    Past Surgical History:  Procedure Laterality Date  . NO PAST SURGERIES      OB History    Gravida Para Term  Preterm AB Living   1 1 1  0 0 1   SAB TAB Ectopic Multiple Live Births   0 0 0 0 1       Home Medications    Prior to Admission medications   Medication Sig Start Date End Date Taking? Authorizing Provider  HYDROcodone-acetaminophen (NORCO/VICODIN) 5-325 MG tablet Take 1 tablet by mouth every 4 (four) hours as needed. 11/06/15   Isla Pence, MD    Family History Family History  Problem Relation Age of Onset  . Adopted: Yes  . Hypertension Mother   . Cancer Maternal Grandmother     breast   . Diabetes Maternal Grandmother     Social History Social History  Substance Use Topics  . Smoking status: Never Smoker  . Smokeless tobacco: Never Used  . Alcohol use No     Comment: "every now and then"     Allergies   Bee venom; Azithromycin; and Levofloxacin   Review of Systems Review of Systems  Musculoskeletal: Positive for arthralgias (right thumb) and joint swelling (right thumb).  Skin: Negative for color change and wound.  Neurological: Negative for weakness and numbness.  All other systems reviewed and are negative.   Physical Exam Updated Vital Signs BP 126/79   Pulse 100   Temp 99.3 F (37.4 C)   Resp 18   Ht 5\' 4"  (1.626 m)   Wt 145 lb (65.8 kg)   LMP 10/22/2015 (Approximate)   SpO2 100%   BMI 24.89 kg/m   Physical Exam  Constitutional: She is oriented to person, place, and time. She appears well-developed and well-nourished. No distress.  HENT:  Head: Normocephalic and atraumatic.  Eyes: Conjunctivae and EOM are normal.  Neck: Neck supple. No tracheal deviation present.  Cardiovascular: Normal rate.   Pulmonary/Chest: Effort normal. No respiratory distress.  Musculoskeletal: Normal range of motion. She exhibits tenderness.  Right thumb: tenderness to nail and phalangeal joint  Neurological: She is alert and oriented to person, place, and time.  Skin: Skin is warm and dry.  Psychiatric: She has a normal mood and affect. Her behavior is normal.   Nursing note and vitals reviewed.   ED Treatments / Results  Labs (all labs ordered are listed, but only abnormal results are displayed) Labs Reviewed - No data to display  EKG  EKG Interpretation None       Radiology Dg Finger Thumb Right  Result Date: 11/06/2015 CLINICAL DATA:  Mid right thumb pain after hitting her hand against a piece of metal at work. Initial encounter. EXAM: RIGHT THUMB 2+V COMPARISON:  None. FINDINGS: On the frontal and oblique view, there is a suggestion of a nondisplaced transverse fracture in the base of the first distal phalanx. This has a more normal appearance on the lateral the view. IMPRESSION: Possible nondisplaced fracture in the base of the first distal phalanx. Immobilization and repeat radiographs in 7-10 days is recommended. Electronically Signed   By: Claudie Revering M.D.   On: 11/06/2015 19:57    Procedures Procedures (including critical care time)  DIAGNOSTIC STUDIES: Oxygen Saturation is 100% on RA, normal by my interpretation.    COORDINATION OF CARE: 8:05 PM Discussed treatment plan with pt at bedside and pt agreed to plan.  Medications Ordered in ED Medications - No data to display   Initial Impression / Assessment and Plan / ED Course  I have reviewed the triage vital signs and the nursing notes.  Pertinent labs & imaging results that were available during my care of the patient were reviewed by me and considered in my medical decision making (see chart for details).  Clinical Course   Pt has a nondisplaced fracture base of 1st distal phalanx.  The pt will be placed in a thumb spica splint.  Dr. Percell Miller is on call for hand and she is instructed to f/u with him.  She is also given a rx for lortab (#10).  I personally performed the services described in this documentation, which was scribed in my presence. The recorded information has been reviewed and is accurate.   Final Clinical Impressions(s) / ED Diagnoses   Final  diagnoses:  Closed nondisplaced fracture of distal phalanx of right thumb, initial encounter    New Prescriptions New Prescriptions   HYDROCODONE-ACETAMINOPHEN (NORCO/VICODIN) 5-325 MG TABLET    Take 1 tablet by mouth every 4 (four) hours as needed.     Isla Pence, MD 11/06/15 2023

## 2015-11-06 NOTE — ED Triage Notes (Signed)
Pt states she hit her R thumb on a metal [piece at work. Pt c/o pain, limited ROM and edema.

## 2015-11-12 ENCOUNTER — Ambulatory Visit: Payer: Self-pay | Admitting: Orthopaedic Surgery

## 2015-11-17 ENCOUNTER — Encounter: Payer: Self-pay | Admitting: Adult Health

## 2015-11-17 ENCOUNTER — Ambulatory Visit (INDEPENDENT_AMBULATORY_CARE_PROVIDER_SITE_OTHER): Payer: Medicaid Other | Admitting: Adult Health

## 2015-11-17 ENCOUNTER — Other Ambulatory Visit (HOSPITAL_COMMUNITY)
Admission: RE | Admit: 2015-11-17 | Discharge: 2015-11-17 | Disposition: A | Discharge status: Home / Self Care | Payer: Self-pay | Source: Ambulatory Visit | Attending: Adult Health | Admitting: Adult Health

## 2015-11-17 VITALS — BP 112/60 | HR 72 | Ht 62.5 in | Wt 150.0 lb

## 2015-11-17 DIAGNOSIS — Z3009 Encounter for other general counseling and advice on contraception: Secondary | ICD-10-CM

## 2015-11-17 DIAGNOSIS — Z308 Encounter for other contraceptive management: Secondary | ICD-10-CM | POA: Diagnosis not present

## 2015-11-17 DIAGNOSIS — Z113 Encounter for screening for infections with a predominantly sexual mode of transmission: Secondary | ICD-10-CM | POA: Insufficient documentation

## 2015-11-17 DIAGNOSIS — R87810 Cervical high risk human papillomavirus (HPV) DNA test positive: Secondary | ICD-10-CM | POA: Insufficient documentation

## 2015-11-17 DIAGNOSIS — Z01419 Encounter for gynecological examination (general) (routine) without abnormal findings: Secondary | ICD-10-CM

## 2015-11-17 DIAGNOSIS — Z30011 Encounter for initial prescription of contraceptive pills: Secondary | ICD-10-CM

## 2015-11-17 DIAGNOSIS — Z01411 Encounter for gynecological examination (general) (routine) with abnormal findings: Secondary | ICD-10-CM | POA: Insufficient documentation

## 2015-11-17 HISTORY — DX: Cervical high risk human papillomavirus (HPV) DNA test positive: R87.810

## 2015-11-17 LAB — POCT URINE PREGNANCY: PREG TEST UR: NEGATIVE

## 2015-11-17 MED ORDER — NORETHIN-ETH ESTRAD-FE BIPHAS 1 MG-10 MCG / 10 MCG PO TABS: 1.0000 | ORAL_TABLET | Freq: Every day | ORAL | 11 refills | Status: DC

## 2015-11-17 NOTE — Patient Instructions (Addendum)
Physical in  Year pap in 3 if normal Start lo loestrin today Use condoms

## 2015-11-17 NOTE — Progress Notes (Signed)
Patient ID: Deanna Sheppard, female   DOB: 13-Aug-1990, 25 y.o.   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   Current Medications, Allergies, Past Medical History, Past Surgical History, Family History and Social History were reviewed in [employer] record.     Review of Systems: Patient denies any headaches, hearing loss, fatigue, blurred vision, shortness of breath, chest pain, abdominal pain, problems with bowel movements, urination, or intercourse. No joint pain or mood swings.weight gain with depo    Physical Exam:BP 112/60 (BP Location: Left Arm, Patient Position: Sitting, Cuff Size: Normal)   Pulse 72   Ht 5' 2.5" (1.588 m)   Wt 150 lb (68 kg)   LMP 11/13/2015   BMI 27.00 kg/m UPT negative. General:  Well developed, well nourished, no acute distress Skin:  Warm and dry Neck:  Midline trachea, normal thyroid, good ROM, no lymphadenopathy Lungs; Clear to auscultation bilaterally Breast:  No dominant palpable mass, retraction, or nipple discharge Cardiovascular: Regular rate and rhythm Abdomen:  Soft, non tender, no hepatosplenomegaly Pelvic:  External genitalia is normal in appearance, no lesions.  The vagina is normal in appearance. Urethra has no lesions or masses. The cervix is smooth, pap with GC/CHL per formed .  Uterus is felt to be normal size, shape, and contour.  No adnexal masses or tenderness noted.Bladder is non tender, no masses felt Extremities/musculoskeletal:  No swelling or varicosities noted, no clubbing or cyanosis Psych:  No mood changes, alert and cooperative,seems happy PHQ 2 score 1 Discussed options will try low dose OCs now and think about IUD.  Impression: 1. Encounter for routine gynecological examination with Papanicolaou smear of cervix   2. Family  planning education, guidance, and counseling   3. Encounter for initial prescription of contraceptive pills   4.       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

## 2015-11-18 LAB — HIV ANTIBODY (ROUTINE TESTING W REFLEX): HIV Screen 4th Generation wRfx: NONREACTIVE

## 2015-11-18 LAB — RPR: RPR: NONREACTIVE

## 2015-11-24 LAB — CYTOLOGY - PAP
CHLAMYDIA, DNA PROBE: NEGATIVE
DIAGNOSIS: NEGATIVE
Neisseria Gonorrhea: NEGATIVE

## 2015-12-24 ENCOUNTER — Ambulatory Visit (INDEPENDENT_AMBULATORY_CARE_PROVIDER_SITE_OTHER): Payer: Medicaid Other | Admitting: Obstetrics and Gynecology

## 2015-12-24 ENCOUNTER — Encounter: Payer: Self-pay | Admitting: Obstetrics and Gynecology

## 2015-12-24 VITALS — BP 100/60 | HR 80 | Ht 64.0 in | Wt 150.0 lb

## 2015-12-24 DIAGNOSIS — Z30014 Encounter for initial prescription of intrauterine contraceptive device: Secondary | ICD-10-CM

## 2015-12-24 DIAGNOSIS — Z308 Encounter for other contraceptive management: Secondary | ICD-10-CM

## 2015-12-24 DIAGNOSIS — Z30430 Encounter for insertion of intrauterine contraceptive device: Secondary | ICD-10-CM

## 2015-12-24 DIAGNOSIS — Z30432 Encounter for removal of intrauterine contraceptive device: Secondary | ICD-10-CM | POA: Diagnosis not present

## 2015-12-24 DIAGNOSIS — Z3202 Encounter for pregnancy test, result negative: Secondary | ICD-10-CM

## 2015-12-24 LAB — POCT URINE PREGNANCY: PREG TEST UR: NEGATIVE

## 2015-12-24 NOTE — Progress Notes (Signed)
  Deanna Sheppard is a 25 y.o. G1P1001 here for Mirena IUD removal and reinsertion. No GYN concerns.  Last pap smear was normal.  LNMP was 11/27/15. She denies chance of pregnancy, sexual partner is female. She is having IUD placed for period control. She reports typically having periods for 7 days; states she was previously taking OCP which helped lighten her bleeding.   IUD Insertion Patient identified, informed consent performed. Discussed risks of irregular bleeding, cramping, infection, malpositioning or misplacement of the IUD outside the uterus which may require further procedures. Time out was performed. Speculum placed in the vagina. Cervix visualized. Cleaned with Betadine x 2.  Uterus is retroverted and sounded to 8 cm.   Mirena IUD placed per manufacturer's recommendations. Strings trimmed to 2-3 cm. Tenaculum was removed, good hemostasis noted. Patient tolerated procedure well. Patient was given post-procedure instructions.  Patient was also asked to check IUD strings periodically and offered follow up in 4 weeks for IUD check.  By signing my name below, I, Sonum Patel, attest that this documentation has been prepared under the direction and in the presence of Mallory Shirk, MD. Electronically Signed: Sonum Patel, Education administrator. 12/24/15. 3:24 PM.  I personally performed the services described in this documentation, which was SCRIBED in my presence. The recorded information has been reviewed and considered accurate. It has been edited as necessary during review. Jonnie Kind, MD

## 2016-01-21 ENCOUNTER — Ambulatory Visit: Payer: Medicaid Other | Admitting: Obstetrics and Gynecology

## 2016-02-11 ENCOUNTER — Ambulatory Visit (INDEPENDENT_AMBULATORY_CARE_PROVIDER_SITE_OTHER): Payer: Medicaid Other | Admitting: Women's Health

## 2016-02-11 ENCOUNTER — Encounter: Payer: Self-pay | Admitting: Women's Health

## 2016-02-11 VITALS — BP 96/58 | HR 60 | Ht 64.0 in | Wt 154.0 lb

## 2016-02-11 DIAGNOSIS — Z30431 Encounter for routine checking of intrauterine contraceptive device: Secondary | ICD-10-CM

## 2016-02-11 NOTE — Progress Notes (Signed)
   [facility] Visit  Patient name: Deanna Sheppard MRN [id number]  Date of birth: 02-16-1990  CC & HPI:  Deanna Sheppard is a 26 y.o. G51P1001 African American female presenting today for IUD check. Had placed 12/24/15, bleeding daily since, heavy at first, now only spotting. Low back pain/cramping since insertion- hasn't tried meds, tried icy hot patch which didn't work.  No LMP recorded.  Last pap nov 2017  Pertinent History Reviewed:  Medical & Surgical Hx:   Past medical, surgical, family, and social history reviewed in electronic medical record Medications: Reviewed & Updated - see associated section Allergies: Reviewed in electronic medical record  Objective Findings:  Vitals: BP (!) 96/58 (BP Location: Right Arm, Patient Position: Sitting, Cuff Size: Normal)   Pulse 60   Ht 5\' 4"  (1.626 m)   Wt 154 lb (69.9 kg)   BMI 26.43 kg/m  Body mass index is 26.43 kg/m.  Physical Examination: General appearance - alert, well appearing, and in no distress Pelvic - IUD strings visible, no bleeding noted Informal transvaginal u/s- iud in correct fundal placement  No results found for this or any previous visit (from the past 24 hour(s)).   Assessment & Plan:  A:   IUD check  Resolving bleeding  Low back pain/cramping  P:  Try otc ibuprofen, warm baths, heating pads for lbp/craming, if not better in 4wks let us know  Return for nov for physical.  Tawnya Crook CNM, [facility] 02/11/2016 4:22 PM

## 2016-02-11 NOTE — Patient Instructions (Signed)
Try ibuprofen, heating pad, warm baths, try to give it a good 4 weeks if you can, if not improving let us know

## 2016-03-01 ENCOUNTER — Telehealth: Payer: Self-pay | Admitting: *Deleted

## 2016-03-01 ENCOUNTER — Encounter: Payer: Self-pay | Admitting: Women's Health

## 2016-03-01 ENCOUNTER — Ambulatory Visit (INDEPENDENT_AMBULATORY_CARE_PROVIDER_SITE_OTHER): Payer: Medicaid Other | Admitting: Women's Health

## 2016-03-01 VITALS — BP 120/62 | HR 75 | Wt 154.6 lb

## 2016-03-01 DIAGNOSIS — N921 Excessive and frequent menstruation with irregular cycle: Secondary | ICD-10-CM

## 2016-03-01 DIAGNOSIS — Z975 Presence of (intrauterine) contraceptive device: Principal | ICD-10-CM

## 2016-03-01 DIAGNOSIS — Z308 Encounter for other contraceptive management: Secondary | ICD-10-CM

## 2016-03-01 MED ORDER — MEGESTROL ACETATE 40 MG PO TABS: ORAL_TABLET | ORAL | 1 refills | Status: DC

## 2016-03-01 NOTE — Progress Notes (Signed)
   [facility] Visit  Patient name: Deanna Sheppard MRN [id number]  Date of birth: [birth year]  CC & HPI:  Deanna Sheppard is a 26 y.o. G57P1001 African American female presenting today initially for IUD removal d/t bleeding. States she has bled daily for the past 3wks like a period. Stopped yesterday. Explained normal bleeding patterns for 1st 3-8mths w/ IUD as her body is getting used to it. Pt states she was unaware of this. Is ok w/ keeping it in for now to see if bleeding resolves. Discussed periods may not stop completely, but should be lighter.  Patient's last menstrual period was 02/16/2016 (approximate). The current method of family planning is IUD. Last pap Nov 2017, neg w/ -GC/CT  Pertinent History Reviewed:  Medical & Surgical Hx:   Past medical, surgical, family, and social history reviewed in electronic medical record Medications: Reviewed & Updated - see associated section Allergies: Reviewed in electronic medical record  Objective Findings:  Vitals: BP 120/62 (BP Location: Right Arm, Patient Position: Sitting, Cuff Size: Normal)   Pulse 75   Wt 154 lb 9.6 oz (70.1 kg)   LMP 02/16/2016 (Approximate)   BMI 26.54 kg/m  Body mass index is 26.54 kg/m.  Physical Examination: General appearance - alert, well appearing, and in no distress  No results found for this or any previous visit (from the past 24 hour(s)).   Assessment & Plan:  A:   Contraception management  Bleeding w/ IUD, currently stopped  P:  Discussed normal bleeding w/ IUD, can call back if bleeding returns and get rx for megace if desires  Return for Nov for pap & physical.  Tawnya Crook CNM, [facility], [place] 03/01/2016 8:58 AM

## 2016-03-02 NOTE — Telephone Encounter (Signed)
Informed patient prescription was sent to pharmacy. 

## 2016-04-08 ENCOUNTER — Encounter: Payer: Self-pay | Admitting: Women's Health

## 2016-04-08 ENCOUNTER — Ambulatory Visit (INDEPENDENT_AMBULATORY_CARE_PROVIDER_SITE_OTHER): Payer: Medicaid Other | Admitting: Women's Health

## 2016-04-08 VITALS — BP 100/56 | HR 68 | Ht 64.0 in | Wt 155.8 lb

## 2016-04-08 DIAGNOSIS — Z30432 Encounter for removal of intrauterine contraceptive device: Secondary | ICD-10-CM | POA: Diagnosis not present

## 2016-04-08 NOTE — Progress Notes (Signed)
Deanna Sheppard is a 26 y.o. year old G69P1001 African American female who presents for removal of a Mirena IUD. Her Mirena IUD was placed 12/24/15.  She came to see me on 2/26 and reported bleeding daily, rx'd megace which she reports didn't help. Just wants IUD out. Has sex w/ other women so not concerned about contraception, was just doing for period control/to see if periods would stop. Periods off contraception are normal/not heavy. Prefers just to go w/o birth control. Last pap 11/17/15, neg  No LMP recorded. Patient is not currently having periods (Reason: IUD). BP (!) 100/56 (BP Location: Right Arm, Patient Position: Sitting, Cuff Size: Normal)   Pulse 68   Ht 5\' 4"  (1.626 m)   Wt 155 lb 12.8 oz (70.7 kg)   BMI 26.74 kg/m   Time out was performed.  A graves speculum was placed in the vagina.  The cervix was visualized, and the strings were visible. They were grasped and the Vennie Homans was easily removed intact without complications.   F/U Nov for physical, or earlier if needed  Tawnya Crook CNM, [facility] 04/08/2016 3:28 PM

## 2016-09-01 ENCOUNTER — Telehealth: Payer: Self-pay | Admitting: *Deleted

## 2016-09-01 NOTE — Telephone Encounter (Signed)
Voice mail not set up @ 2:10 pm. [name]

## 2016-09-01 NOTE — Telephone Encounter (Signed)
Spoke with pt. Pt had 2 periods in August. Not on birth control. + cramps, back pain, and headaches. I advised pt needs to be seen. Pt voiced understanding and call was transferred to front desk. [place]

## 2016-09-07 ENCOUNTER — Ambulatory Visit (INDEPENDENT_AMBULATORY_CARE_PROVIDER_SITE_OTHER): Payer: Medicaid Other | Admitting: Obstetrics and Gynecology

## 2016-09-07 ENCOUNTER — Encounter: Payer: Self-pay | Admitting: Obstetrics and Gynecology

## 2016-09-07 DIAGNOSIS — R351 Nocturia: Secondary | ICD-10-CM | POA: Diagnosis not present

## 2016-09-07 LAB — POCT URINALYSIS DIPSTICK
Glucose, UA: NEGATIVE
Ketones, UA: NEGATIVE
Leukocytes, UA: NEGATIVE
Nitrite, UA: NEGATIVE
PROTEIN UA: NEGATIVE
RBC UA: NEGATIVE

## 2016-09-07 NOTE — Progress Notes (Signed)
   [facility] Visit  @DATE @            Patient name: Deanna Sheppard MRN [id number]  Date of birth: September 24, 1990  CC & HPI:  Deanna Sheppard is a 26 y.o. female presenting today for Follow-up on abnormal cycle. For the first time in years she had t 2 menstrual bleeding episodes in 1 month   ROS:  ROS   Pertinent History Reviewed:   Reviewed: Significant for Multiple efforts have been tried to control her cycles with , Depakote, most recently IUD which was removed in April. The patient has been very frustrated by bleeding irregularities..  Medical         Past Medical History:  Diagnosis Date  . Abdominal pain 11/15/2014  . 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 Chlamydia   . Contraception management 04/13/2012  . Cough 07/23/2014  . History of chlamydia 06/06/2013  . Irregular intermenstrual bleeding 11/15/2014  . No pertinent past medical history   . Pain with urination 11/15/2014  . Rib pain on left side 07/23/2014  . Sickle cell trait ([place])   . Urinary frequency 10/17/2012                              Surgical Hx:    Past Surgical History:  Procedure Laterality Date  . NO PAST SURGERIES     Medications: Reviewed & Updated - see associated section                       Current Outpatient Prescriptions:  .  megestrol (MEGACE) 40 MG tablet, 3x5d, 2x5d, then 1 daily to help control vaginal bleeding. Stop taking when bleeding stops., Disp: 45 tablet, Rfl: 1   Social History: Reviewed -  reports that she has never smoked. She has never used smokeless tobacco.  Objective Findings:  Vitals: Blood pressure (!) 80/60, pulse 80, weight 162 lb (73.5 kg), last menstrual period 08/19/2016.  Physical Examination: General appearance - alert, well appearing, and in no distress, oriented to person, place, and time and overweight Mental status - alert, oriented to person, place, and time, normal mood, behavior, speech, dress, motor  activity, and thought processes Abdomen - soft, nontender, nondistended, no masses or organomegaly Pelvic - no exam, bleeding has resolved   Assessment & Plan:   A:  1. Resolved abnormal uterine bleeding probably anovulatory  P:  1. Will follow menstrual calendar 6 weeks. Patient to use flow App. If this recurs may consider Provera 10 mg 10-14 days to cause a withdrawal bleed  Follow up when necessary or 6 weeks for review of menstrual calendar

## 2016-10-20 ENCOUNTER — Ambulatory Visit: Payer: Medicaid Other | Admitting: Obstetrics and Gynecology

## 2016-10-20 ENCOUNTER — Encounter: Payer: Self-pay | Admitting: *Deleted

## 2017-01-17 ENCOUNTER — Other Ambulatory Visit: Payer: Self-pay | Admitting: Adult Health

## 2017-03-28 ENCOUNTER — Encounter: Payer: Self-pay | Admitting: Obstetrics and Gynecology

## 2017-03-28 ENCOUNTER — Ambulatory Visit (INDEPENDENT_AMBULATORY_CARE_PROVIDER_SITE_OTHER): Payer: Medicaid Other | Admitting: Obstetrics and Gynecology

## 2017-03-28 ENCOUNTER — Other Ambulatory Visit (HOSPITAL_COMMUNITY)
Admission: RE | Admit: 2017-03-28 | Discharge: 2017-03-28 | Disposition: A | Discharge status: Home / Self Care | Payer: Self-pay | Source: Ambulatory Visit | Attending: Obstetrics and Gynecology | Admitting: Obstetrics and Gynecology

## 2017-03-28 VITALS — BP 90/70 | HR 92 | Ht 64.0 in | Wt 161.0 lb

## 2017-03-28 DIAGNOSIS — Z309 Encounter for contraceptive management, unspecified: Secondary | ICD-10-CM | POA: Diagnosis not present

## 2017-03-28 DIAGNOSIS — Z01419 Encounter for gynecological examination (general) (routine) without abnormal findings: Secondary | ICD-10-CM

## 2017-03-28 NOTE — Progress Notes (Signed)
Patient ID: Deanna Sheppard, female   DOB: 1990-07-16, 27 y.o.   MRN: [id number]  Assessment:  1. Annual Gyn Exam Plan:  1. pap smear done, next pap due 3 years 2. return annually or prn 3. Annual mammogram advised after age 52 4. Continue at home breast exams 5. STD screening 6. Contraception management:Depo Subjective:  Deanna Sheppard is a 27 y.o. female G1P1001 who presents for annual exam. Patient's last menstrual period was 03/25/2017. The patient has no complaints today. She is currently on her menstrual cycle and notes that it is not very heavy. On Depo as BC  The following portions of the patient's history were reviewed and updated as appropriate: allergies, current medications, past family history, past medical history, past social history, past surgical history and problem list. Past Medical History:  Diagnosis Date  . Abdominal pain 11/15/2014  . 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 Chlamydia   . Contraception management 04/13/2012  . Cough 07/23/2014  . History of chlamydia 06/06/2013  . Irregular intermenstrual bleeding 11/15/2014  . No pertinent past medical history   . Pain with urination 11/15/2014  . Rib pain on left side 07/23/2014  . Sickle cell trait ([place])   . Urinary frequency 10/17/2012    Past Surgical History:  Procedure Laterality Date  . NO PAST SURGERIES       Current Outpatient Medications:  .  megestrol (MEGACE) 40 MG tablet, 3x5d, 2x5d, then 1 daily to help control vaginal bleeding. Stop taking when bleeding stops. (Patient not taking: Reported on 03/28/2017), Disp: 45 tablet, Rfl: 1  Review of Systems Constitutional: negative Gastrointestinal: negative Genitourinary: negative  Objective:  Pulse 92   Ht 5\' 4"  (1.626 m)   Wt 161 lb (73 kg)   LMP 03/25/2017   BMI 27.64 kg/m    BMI: Body mass index is 27.64 kg/m.  General Appearance: Alert, appropriate appearance for age. No acute distress HEENT:  Grossly normal Neck / Thyroid:  Cardiovascular: RRR; normal S1, S2, no murmur Lungs: CTA bilaterally Back: No CVAT Breast Exam: normal tissue bilaterally, no palpable masses Gastrointestinal: Soft, non-tender, no masses or organomegaly Pelvic Exam:  External genitalia - normal Cervix - smooth, nulliparous, no abnormalities  Rectovaginal: not indicated Lymphatic Exam: Non-palpable nodes in neck, clavicular, axillary, or inguinal regions  Skin: no rash or abnormalities Neurologic: Normal gait and speech, no tremor  Psychiatric: Alert and oriented, appropriate affect.  Urinalysis:Not done   By signing my name below, I, Margit Banda, attest that this documentation has been prepared under the direction and in the presence of Jonnie Kind, MD. Electronically Signed: Margit Banda, Medical Scribe. 03/28/17. 4:07 PM.  I personally performed the services described in this documentation, which was SCRIBED in my presence. The recorded information has been reviewed and considered accurate. It has been edited as necessary during review. Jonnie Kind, MD

## 2017-03-29 ENCOUNTER — Telehealth: Payer: Self-pay | Admitting: *Deleted

## 2017-03-29 ENCOUNTER — Ambulatory Visit (INDEPENDENT_AMBULATORY_CARE_PROVIDER_SITE_OTHER): Payer: Medicaid Other | Admitting: *Deleted

## 2017-03-29 DIAGNOSIS — Z3042 Encounter for surveillance of injectable contraceptive: Secondary | ICD-10-CM

## 2017-03-29 DIAGNOSIS — Z3202 Encounter for pregnancy test, result negative: Secondary | ICD-10-CM | POA: Diagnosis not present

## 2017-03-29 DIAGNOSIS — Z3049 Encounter for surveillance of other contraceptives: Secondary | ICD-10-CM

## 2017-03-29 LAB — RPR: RPR Ser Ql: NONREACTIVE

## 2017-03-29 LAB — HIV ANTIBODY (ROUTINE TESTING W REFLEX): HIV Screen 4th Generation wRfx: NONREACTIVE

## 2017-03-29 LAB — POCT URINE PREGNANCY: PREG TEST UR: NEGATIVE

## 2017-03-29 MED ORDER — MEDROXYPROGESTERONE ACETATE 150 MG/ML IM SUSP
150.0000 mg | Freq: Once | INTRAMUSCULAR | Status: AC
  Administered 2017-03-29: 150 mg via INTRAMUSCULAR

## 2017-03-29 NOTE — Telephone Encounter (Signed)
Pt was seen yesterday but Depo wasn't ordered. Depo Provera 150 mg injection # 1 inject in office every 12 weeks with 12 refills ok per [facility] was called into [place]. [place]

## 2017-03-29 NOTE — Progress Notes (Signed)
Pt given DepoProvera 150mg IM right deltoid without complications. Advised to return in 12 weeks for next injection. 

## 2017-03-31 LAB — CYTOLOGY - PAP
Chlamydia: POSITIVE — AB
Diagnosis: NEGATIVE
Neisseria Gonorrhea: NEGATIVE

## 2017-04-01 ENCOUNTER — Other Ambulatory Visit: Payer: Self-pay | Admitting: Obstetrics and Gynecology

## 2017-04-01 MED ORDER — AZITHROMYCIN 500 MG PO TABS: 1000.0000 mg | ORAL_TABLET | Freq: Once | ORAL | 1 refills | Status: AC

## 2017-04-01 NOTE — Progress Notes (Signed)
+   chlamydia, will contact pt . Epic sent, will have RN call pt .  Escribed Rx to pharmacy for pt with refil to cover for partner.

## 2017-04-02 ENCOUNTER — Encounter: Payer: Self-pay | Admitting: Obstetrics and Gynecology

## 2017-04-04 ENCOUNTER — Other Ambulatory Visit: Payer: Self-pay | Admitting: Obstetrics and Gynecology

## 2017-04-05 ENCOUNTER — Encounter: Payer: Self-pay | Admitting: Obstetrics and Gynecology

## 2017-04-05 ENCOUNTER — Other Ambulatory Visit: Payer: Self-pay | Admitting: Obstetrics and Gynecology

## 2017-04-05 DIAGNOSIS — A5609 Other chlamydial infection of lower genitourinary tract: Secondary | ICD-10-CM | POA: Insufficient documentation

## 2017-04-05 MED ORDER — AZITHROMYCIN 500 MG PO TABS: 1000.0000 mg | ORAL_TABLET | Freq: Once | ORAL | 1 refills | Status: AC

## 2017-04-05 NOTE — Progress Notes (Signed)
A azithromycin called into the pharmacy.  Patient reports stomach upset with a azithromycin which is a predictable side effect.  If she takes the medicine with food intake she should be fine the refill is for the partner

## 2017-04-05 NOTE — Progress Notes (Signed)
Azithromycin ordered for Chlamydia

## 2017-05-05 ENCOUNTER — Encounter: Payer: Self-pay | Admitting: Adult Health

## 2017-05-06 ENCOUNTER — Encounter: Payer: Self-pay | Admitting: Adult Health

## 2017-05-09 ENCOUNTER — Encounter: Payer: Self-pay | Admitting: Adult Health

## 2017-05-11 ENCOUNTER — Ambulatory Visit: Payer: Medicaid Other | Admitting: Adult Health

## 2017-05-16 ENCOUNTER — Encounter: Payer: Self-pay | Admitting: *Deleted

## 2017-05-16 ENCOUNTER — Ambulatory Visit: Payer: Medicaid Other | Admitting: Adult Health

## 2017-06-06 ENCOUNTER — Encounter: Payer: Self-pay | Admitting: Obstetrics and Gynecology

## 2017-06-21 ENCOUNTER — Ambulatory Visit: Payer: Medicaid Other

## 2017-06-22 ENCOUNTER — Ambulatory Visit: Payer: Medicaid Other

## 2017-06-27 ENCOUNTER — Encounter: Payer: Self-pay | Admitting: Obstetrics & Gynecology

## 2017-06-27 ENCOUNTER — Ambulatory Visit: Payer: Medicaid Other

## 2017-07-12 ENCOUNTER — Encounter: Payer: Self-pay | Admitting: Obstetrics and Gynecology

## 2017-07-13 ENCOUNTER — Telehealth: Payer: Self-pay | Admitting: *Deleted

## 2017-07-13 NOTE — Telephone Encounter (Signed)
Erroneous encounter

## 2017-07-16 ENCOUNTER — Other Ambulatory Visit: Payer: Self-pay

## 2017-07-16 ENCOUNTER — Encounter (HOSPITAL_COMMUNITY): Payer: Self-pay | Admitting: Emergency Medicine

## 2017-07-16 ENCOUNTER — Emergency Department (HOSPITAL_COMMUNITY)
Admission: EM | Admit: 2017-07-16 | Discharge: 2017-07-16 | Disposition: A | Discharge status: Home / Self Care | Payer: Medicaid Other | Attending: Emergency Medicine | Admitting: Emergency Medicine

## 2017-07-16 ENCOUNTER — Emergency Department (HOSPITAL_COMMUNITY): Payer: Medicaid Other

## 2017-07-16 DIAGNOSIS — R072 Precordial pain: Secondary | ICD-10-CM | POA: Diagnosis not present

## 2017-07-16 DIAGNOSIS — R1013 Epigastric pain: Secondary | ICD-10-CM | POA: Insufficient documentation

## 2017-07-16 DIAGNOSIS — R55 Syncope and collapse: Secondary | ICD-10-CM | POA: Insufficient documentation

## 2017-07-16 LAB — D-DIMER, QUANTITATIVE: D-Dimer, Quant: <0.27 ug/mL-FEU (ref 0.00–0.50)

## 2017-07-16 LAB — POC URINE PREG, ED: PREG TEST UR: NEGATIVE

## 2017-07-16 LAB — COMPREHENSIVE METABOLIC PANEL
ALBUMIN: 4.3 g/dL (ref 3.5–5.0)
ALK PHOS: 50 U/L (ref 38–126)
ALT: 31 U/L (ref 0–44)
ANION GAP: 9 (ref 5–15)
AST: 30 U/L (ref 15–41)
BUN: 8 mg/dL (ref 6–20)
CALCIUM: 9.4 mg/dL (ref 8.9–10.3)
CHLORIDE: 103 mmol/L (ref 98–111)
CO2: 28 mmol/L (ref 22–32)
Creatinine, Ser: 0.71 mg/dL (ref 0.44–1.00)
GFR calc non Af Amer: >60 mL/min (ref >60)
Glucose, Bld: 116 mg/dL — ABNORMAL HIGH (ref 70–99)
POTASSIUM: 3.4 mmol/L — AB (ref 3.5–5.1)
SODIUM: 140 mmol/L (ref 135–145)
Total Bilirubin: 0.7 mg/dL (ref 0.3–1.2)
Total Protein: 8 g/dL (ref 6.5–8.1)

## 2017-07-16 LAB — CBC WITH DIFFERENTIAL/PLATELET
BASOS PCT: 0 %
Basophils Absolute: 0 10*3/uL (ref 0.0–0.1)
Eosinophils Absolute: 0.1 10*3/uL (ref 0.0–0.7)
Eosinophils Relative: 1 %
HEMATOCRIT: 38.9 % (ref 36.0–46.0)
HEMOGLOBIN: 13.7 g/dL (ref 12.0–15.0)
Lymphocytes Relative: 21 %
Lymphs Abs: 1.9 10*3/uL (ref 0.7–4.0)
MCH: 29.3 pg (ref 26.0–34.0)
MCHC: 35.2 g/dL (ref 30.0–36.0)
MCV: 83.1 fL (ref 78.0–100.0)
MONOS PCT: 6 %
Monocytes Absolute: 0.6 10*3/uL (ref 0.1–1.0)
NEUTROS ABS: 6.4 10*3/uL (ref 1.7–7.7)
NEUTROS PCT: 72 %
Platelets: 353 10*3/uL (ref 150–400)
RBC: 4.68 MIL/uL (ref 3.87–5.11)
RDW: 12.2 % (ref 11.5–15.5)
WBC: 8.9 10*3/uL (ref 4.0–10.5)

## 2017-07-16 LAB — ETHANOL

## 2017-07-16 LAB — LIPASE, BLOOD: Lipase: 28 U/L (ref 11–51)

## 2017-07-16 MED ORDER — KETOROLAC TROMETHAMINE 30 MG/ML IJ SOLN
30.0000 mg | Freq: Once | INTRAMUSCULAR | Status: AC
  Administered 2017-07-16: 30 mg via INTRAVENOUS
  Filled 2017-07-16: qty 1

## 2017-07-16 MED ORDER — SODIUM CHLORIDE 0.9 % IV BOLUS (SEPSIS)
1000.0000 mL | Freq: Once | INTRAVENOUS | Status: AC
  Administered 2017-07-16: 1000 mL via INTRAVENOUS

## 2017-07-16 MED ORDER — FENTANYL CITRATE (PF) 100 MCG/2ML IJ SOLN: 50.0000 ug | Freq: Once | INTRAMUSCULAR | Status: DC

## 2017-07-16 MED ORDER — SODIUM CHLORIDE 0.9 % IV BOLUS (SEPSIS): 1000.0000 mL | Freq: Once | INTRAVENOUS | Status: DC

## 2017-07-16 NOTE — Medical Student Note (Signed)
[facility] DEPT Provider Student Note For educational purposes for Medical, PA and NP students only and not part of the legal medical record.   CSN: [id number] Arrival date & time: 07/16/17  0046     History   Chief Complaint Chief Complaint  Patient presents with  . Loss of Consciousness    HPI Deanna Sheppard is a 27 y.o. female.  This is a 27 year old female patient with a sudden onset of central, upper, non-radiating abdominal pain. This started tonight after returning home from eating out.  Friends/family report that she sat on bed and lost consciousness. They then called 911. She reports upper central abdominal pain at this time.  She reports one episode of vomiting. She admits to ETOH intake as "one can of a sweet beer" EMS reports she stated "I drank too much" while en route to ED. Patient states this was a single occurrence and has not happened before.  The history is provided by the patient and the EMS personnel.  Loss of Consciousness   This is a new problem. The current episode started 1 to 2 hours ago. The problem has not changed since onset.She lost consciousness for a period of 1 to 5 minutes. The problem is associated with normal activity (Patient admits to ETOH ingestion tonight.). Associated symptoms include chest pain, dizziness, vertigo and vomiting. Pertinent negatives include bladder incontinence and bowel incontinence. She has tried nothing for the symptoms.    Past Medical History:  Diagnosis Date  . Abdominal pain 11/15/2014  . 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 Chlamydia   . Contraception management 04/13/2012  . Cough 07/23/2014  . History of chlamydia 06/06/2013  . Irregular intermenstrual bleeding 11/15/2014  . No pertinent past medical history   . Pain with urination 11/15/2014  . Rib pain on left side 07/23/2014  . Sickle cell trait ([place])   . Urinary frequency 10/17/2012    Patient Active  Problem List   Diagnosis Date Noted  . Chlamydial cervicitis and pregnancy 04/05/2017  . Encounter for insertion of mirena IUD 12/24/2015  . 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 Pain with urination 11/15/2014  . Irregular intermenstrual bleeding 11/15/2014  . Rib pain on left side 07/23/2014  . Cough 07/23/2014  . History of chlamydia 06/06/2013  . Urinary frequency 10/17/2012  . Sickle cell trait ([place]) 04/12/2012    Past Surgical History:  Procedure Laterality Date  . NO PAST SURGERIES      OB History    Gravida  1   Para  1   Term  1   Preterm  0   AB  0   Living  1     SAB  0   TAB  0   Ectopic  0   Multiple  0   Live Births  1            Home Medications    Prior to Admission medications   Medication Sig Start Date End Date Taking? Authorizing Provider  megestrol (MEGACE) 40 MG tablet 3x5d, 2x5d, then 1 daily to help control vaginal bleeding. Stop taking when bleeding stops. Patient not taking: Reported on 03/28/2017 03/01/16   Roma Schanz, CNM    Family History Family History  Adopted: Yes  Problem Relation Age of Onset  . Hypertension Mother   . Cancer Maternal Grandmother        breast   . Diabetes Maternal  Grandmother     Social History Social History   Tobacco Use  . Smoking status: Never Smoker  . Smokeless tobacco: Never Used  Substance Use Topics  . Alcohol use: Yes    Comment: "every now and then"  . Drug use: No     Allergies   Bee venom; Azithromycin; and Levofloxacin   Review of Systems Review of Systems  Constitutional: Negative.   HENT: Negative.   Eyes: Negative.   Respiratory: Negative.   Cardiovascular: Positive for chest pain and syncope.       She describes her problem as a "pounding" pulse.  Gastrointestinal: Positive for vomiting. Negative for bowel incontinence.  Endocrine: Negative.   Genitourinary: Positive for vaginal bleeding. Negative for bladder  incontinence.       She is currently having her period.  Musculoskeletal: Negative.   Allergic/Immunologic: Negative.   Neurological: Positive for dizziness, vertigo and syncope. Negative for facial asymmetry.  Hematological: Negative.   Psychiatric/Behavioral:       She is shaking her leg constantly and limited with her responses to questions     Physical Exam Updated Vital Signs BP 116/76   Pulse 99   Temp 98.4 F (36.9 C) (Oral)   Resp 18   LMP 07/16/2017   SpO2 98%   Physical Exam  Constitutional: She is oriented to person, place, and time. She appears well-developed and well-nourished.  HENT:  Head: Normocephalic and atraumatic.  Eyes: Pupils are equal, round, and reactive to light.  Neck: Normal range of motion. Neck supple.  Cardiovascular: Normal rate, regular rhythm and intact distal pulses. Exam reveals no gallop and no friction rub.  No murmur heard. Rate of 100 on EKG monitor.  Pulmonary/Chest: Effort normal and breath sounds normal.  Slight tachypnea with rate of 22   Genitourinary:  Genitourinary Comments: Per patient she is currently having her period.   Musculoskeletal: Normal range of motion.  Neurological: She is alert and oriented to person, place, and time.  Skin: Skin is warm and dry. Capillary refill takes less than 2 seconds.     ED Treatments / Results  Labs (all labs ordered are listed, but only abnormal results are displayed) Labs Reviewed  POC URINE PREG, ED    EKG None Radiology No results found.  Procedures Procedures (including critical care time)  Medications Ordered in ED Medications - No data to display   Initial Impression / Assessment and Plan / ED Course  I have reviewed the triage vital signs and the nursing notes.  Pertinent labs & imaging results that were available during my care of the patient were reviewed by me and considered in my medical decision making (see chart for details).     Final Clinical  Impressions(s) / ED Diagnoses   Final diagnoses:  None    New Prescriptions New Prescriptions   No medications on file

## 2017-07-16 NOTE — ED Notes (Signed)
Pt ambulated in hallway with steady gait, pt reports no pain or dizziness

## 2017-07-16 NOTE — ED Provider Notes (Signed)
[facility] EMERGENCY DEPARTMENT Provider Note   CSN: [id number] Arrival date & time: 07/16/17  0046     History   Chief Complaint Chief Complaint  Patient presents with  . Loss of Consciousness     Pt seen with NP student, I performed history/physical/documentation  HPI Deanna Sheppard is a 27 y.o. female.  The history is provided by the patient.  Loss of Consciousness   This is a new problem. The current episode started less than 1 hour ago. The problem occurs constantly. The problem has been resolved. She lost consciousness for a period of 1 to 5 minutes. Associated symptoms include abdominal pain, chest pain, headaches, nausea and vomiting. Pertinent negatives include fever and seizures. She has tried nothing for the symptoms.   Patient presents with syncopal episode She reports she was at [employer].  She ate an omelette on the way home and immediately felt sick.  She reports she started having headache, chest pain and abdominal pain.  After getting home she had a brief syncopal episode  And there was a report via EMS that she may have received CPR, but on EMS arrival patient had pulses.  She woke up with an ammonia inhalant family and patient are unable to confirm any CPR was given  Patient currently having chest pain, epigastric abdominal pain and headache.  She had otherwise been well prior to going to [facility].  No reported seizures.  No tongue lacerations.  No incontinence. No trauma or injuries are noted from the syncopal episode Past Medical History:  Diagnosis Date  . Abdominal pain 11/15/2014  . 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 Chlamydia   . Contraception management 04/13/2012  . Cough 07/23/2014  . History of chlamydia 06/06/2013  . Irregular intermenstrual bleeding 11/15/2014  . No pertinent past medical history   . Pain with urination 11/15/2014  . Rib pain on left side 07/23/2014  . Sickle cell trait  ([place])   . Urinary frequency 10/17/2012    Patient Active Problem List   Diagnosis Date Noted  . Chlamydial cervicitis and pregnancy 04/05/2017  . Encounter for insertion of mirena IUD 12/24/2015  . 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 Pain with urination 11/15/2014  . Irregular intermenstrual bleeding 11/15/2014  . Rib pain on left side 07/23/2014  . Cough 07/23/2014  . History of chlamydia 06/06/2013  . Urinary frequency 10/17/2012  . Sickle cell trait ([place]) 04/12/2012    Past Surgical History:  Procedure Laterality Date  . NO PAST SURGERIES       OB History    Gravida  1   Para  1   Term  1   Preterm  0   AB  0   Living  1     SAB  0   TAB  0   Ectopic  0   Multiple  0   Live Births  1            Home Medications    Prior to Admission medications   Medication Sig Start Date End Date Taking? Authorizing Provider  megestrol (MEGACE) 40 MG tablet 3x5d, 2x5d, then 1 daily to help control vaginal bleeding. Stop taking when bleeding stops. Patient not taking: Reported on 03/28/2017 03/01/16   Roma Schanz, CNM    Family History Family History  Adopted: Yes  Problem Relation Age of Onset  . Hypertension Mother   . Cancer Maternal  Grandmother        breast   . Diabetes Maternal Grandmother     Social History Social History   Tobacco Use  . Smoking status: Never Smoker  . Smokeless tobacco: Never Used  Substance Use Topics  . Alcohol use: Yes    Comment: "every now and then"  . Drug use: No     Allergies   Bee venom; Azithromycin; and Levofloxacin   Review of Systems Review of Systems  Constitutional: Negative for fever.  Cardiovascular: Positive for chest pain and syncope.  Gastrointestinal: Positive for abdominal pain, nausea and vomiting.  Neurological: Positive for syncope and headaches. Negative for seizures.  All other systems reviewed and are negative.    Physical Exam Updated  Vital Signs BP 100/70   Pulse 92   Temp 98.4 F (36.9 C) (Oral)   Resp 18   LMP 07/16/2017   SpO2 97%   Physical Exam CONSTITUTIONAL: Well developed/well nourished HEAD: Normocephalic/atraumatic, no signs of trauma EYES: EOMI/PERRL ENMT: Mucous membranes moist, no tongue lacerations NECK: supple no meningeal signs SPINE/BACK:entire spine nontender CV: S1/S2 noted, no murmurs/rubs/gallops noted LUNGS: Decreased breath sounds bilaterally Chest-sternal chest wall tenderness, no crepitus or bruising ABDOMEN: soft, mild epigastric tenderness, no rebound or guarding, bowel sounds noted throughout abdomen GU:no cva tenderness NEURO: Pt is awake/alert/appropriate, moves all extremitiesx4.  No facial droop.  No arm or leg drift.  Patient is conversant and in no distress normal speech pattern. EXTREMITIES: pulses normal/equal, full ROM, no deformities are noted, no tenderness SKIN: warm, color normal PSYCH: no abnormalities of mood noted, alert and oriented to situation   ED Treatments / Results  Labs (all labs ordered are listed, but only abnormal results are displayed) Labs Reviewed  COMPREHENSIVE METABOLIC PANEL - Abnormal; Notable for the following components:      Result Value   Potassium 3.4 (*)    Glucose, Bld 116 (*)    All other components within normal limits  CBC WITH DIFFERENTIAL/PLATELET  LIPASE, BLOOD  ETHANOL  D-DIMER, QUANTITATIVE (NOT AT [facility])  POC URINE PREG, ED    EKG ED ECG REPORT   Date: 07/16/2017 01:12  Rate: 100  Rhythm: sinus tachycardia  QRS Axis: normal  Intervals: normal  ST/T Wave abnormalities: t wave inversion  Conduction Disutrbances:none  Narrative Interpretation:   Old EKG Reviewed: none available  I have personally reviewed the EKG tracing and agree with the computerized printout as noted.  Radiology Dg Chest 2 View  Result Date: 07/16/2017 CLINICAL DATA:  27 year old female with chest pain. EXAM: CHEST - 2 VIEW COMPARISON:  Chest  radiograph dated 06/13/2015 FINDINGS: The heart size and mediastinal contours are within normal limits. Both lungs are clear. The visualized skeletal structures are unremarkable. IMPRESSION: No active cardiopulmonary disease. Electronically Signed   By: Anner Crete M.D.   On: 07/16/2017 03:47    Procedures Procedures    Medications Ordered in ED Medications  sodium chloride 0.9 % bolus 1,000 mL (0 mLs Intravenous Stopped 07/16/17 0448)  ketorolac (TORADOL) 30 MG/ML injection 30 mg (30 mg Intravenous Given 07/16/17 0239)     Initial Impression / Assessment and Plan / ED Course  I have reviewed the triage vital signs and the nursing notes.  Pertinent labs & imaging results that were available during my care of the patient were reviewed by me and considered in my medical decision making (see chart for details).     2:52 AM She presents after syncopal episode after eating pancakes and  omelettes.  She has multiple complaints including headache, chest pain, abdominal pain.  Her story has changed since first arriving, as she gave nursing staff a different story. Work-up is pending at this time. Of note, family and bystanders are not giving any other information Patient had mentioned drinking alcohol and "sweet beer", but did not mention this on my exam 5:01 AM Patient feels improved.  She was resting comfortably.  She is able to walk without any difficulty.  She denies dizziness denies any new pain. Her girlfriend now reports the patient had been drinking heavily, then got sick at [facility] It is possible she was dehydrated which contributed to this episode. Will discharge home Final Clinical Impressions(s) / ED Diagnoses   Final diagnoses:  Syncope and collapse  Epigastric pain  Precordial pain    ED Discharge Orders    None       Ripley Fraise, MD 07/16/17 0501

## 2017-07-16 NOTE — ED Triage Notes (Signed)
Ems called out for LOC with possible CPR. EMS arrived and pt had pulse but was unconscious. Pt woke with ammonia inhalant. Pt then told EMS that she drank too much alcohol. Pt had n/v with EMS. Pt denies pain at this time. [address]

## 2017-07-28 ENCOUNTER — Ambulatory Visit: Payer: Medicaid Other | Admitting: Obstetrics and Gynecology

## 2017-07-28 ENCOUNTER — Encounter: Payer: Self-pay | Admitting: *Deleted

## 2017-11-21 ENCOUNTER — Encounter: Payer: Self-pay | Admitting: *Deleted

## 2017-11-21 ENCOUNTER — Ambulatory Visit: Payer: Medicaid Other | Admitting: Obstetrics & Gynecology

## 2018-01-02 ENCOUNTER — Other Ambulatory Visit: Payer: Self-pay

## 2018-01-02 ENCOUNTER — Emergency Department (HOSPITAL_COMMUNITY)
Admission: EM | Admit: 2018-01-02 | Discharge: 2018-01-02 | Disposition: A | Discharge status: Home / Self Care | Payer: Medicaid Other | Attending: Emergency Medicine | Admitting: Emergency Medicine

## 2018-01-02 ENCOUNTER — Encounter (HOSPITAL_COMMUNITY): Payer: Self-pay

## 2018-01-02 DIAGNOSIS — L509 Urticaria, unspecified: Secondary | ICD-10-CM

## 2018-01-02 DIAGNOSIS — D573 Sickle-cell trait: Secondary | ICD-10-CM | POA: Insufficient documentation

## 2018-01-02 MED ORDER — FAMOTIDINE 20 MG PO TABS
40.0000 mg | ORAL_TABLET | Freq: Once | ORAL | Status: AC
  Administered 2018-01-02: 40 mg via ORAL
  Filled 2018-01-02: qty 2

## 2018-01-02 MED ORDER — PREDNISONE 50 MG PO TABS
60.0000 mg | ORAL_TABLET | Freq: Once | ORAL | Status: AC
  Administered 2018-01-02: 60 mg via ORAL
  Filled 2018-01-02: qty 1

## 2018-01-02 NOTE — ED Triage Notes (Signed)
Pt reports red raised rash all over for past few days.  Denies using any new lotions, cream, detergents, etc.

## 2018-01-02 NOTE — Discharge Instructions (Addendum)
Continue taking Pepcid daily. Take Benadryl if not improving with Pepcid and the steroid dose given in the ER today. To ER for new or worsening symptoms.

## 2018-01-02 NOTE — ED Provider Notes (Signed)
[facility] EMERGENCY DEPARTMENT Provider Note   CSN: [id number] Arrival date & time: 01/02/18  1126     History   Chief Complaint No chief complaint on file.   HPI Deanna Sheppard is a 27 y.o. female.  27 year old female presents with complaint of hives to her arms and upper chest area.  Patient states she is a history of hives and takes Benadryl as needed however cannot take Benadryl today because it would make her sleepy and she was at work.  Patient denies difficulty breathing or swallowing.  No other complaints or concerns.  No known exposure to allergens.     Past Medical History:  Diagnosis Date  . Abdominal pain 11/15/2014  . 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 Chlamydia   . Contraception management 04/13/2012  . Cough 07/23/2014  . History of chlamydia 06/06/2013  . Irregular intermenstrual bleeding 11/15/2014  . No pertinent past medical history   . Pain with urination 11/15/2014  . Rib pain on left side 07/23/2014  . Sickle cell trait ([place])   . Urinary frequency 10/17/2012    Patient Active Problem List   Diagnosis Date Noted  . Chlamydial cervicitis and pregnancy 04/05/2017  . Encounter for insertion of mirena IUD 12/24/2015  . 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 Pain with urination 11/15/2014  . Irregular intermenstrual bleeding 11/15/2014  . Rib pain on left side 07/23/2014  . Cough 07/23/2014  . History of chlamydia 06/06/2013  . Urinary frequency 10/17/2012  . Sickle cell trait ([place]) 04/12/2012    Past Surgical History:  Procedure Laterality Date  . NO PAST SURGERIES       OB History    Gravida  1   Para  1   Term  1   Preterm  0   AB  0   Living  1     SAB  0   TAB  0   Ectopic  0   Multiple  0   Live Births  1            Home Medications    Prior to Admission medications   Medication Sig Start Date End Date Taking? Authorizing Provider   megestrol (MEGACE) 40 MG tablet 3x5d, 2x5d, then 1 daily to help control vaginal bleeding. Stop taking when bleeding stops. Patient not taking: Reported on 03/28/2017 03/01/16   Roma Schanz, CNM    Family History Family History  Adopted: Yes  Problem Relation Age of Onset  . Hypertension Mother   . Cancer Maternal Grandmother        breast   . Diabetes Maternal Grandmother     Social History Social History   Tobacco Use  . Smoking status: Never Smoker  . Smokeless tobacco: Never Used  Substance Use Topics  . Alcohol use: Yes    Comment: "every now and then"  . Drug use: No     Allergies   Bee venom; Azithromycin; and Levofloxacin   Review of Systems Review of Systems  Constitutional: Negative for fever.  HENT: Negative for sore throat, trouble swallowing and voice change.   Respiratory: Negative for shortness of breath.   Musculoskeletal: Negative for joint swelling.  Skin: Positive for rash. Negative for wound.  Allergic/Immunologic: Negative for immunocompromised state.  Neurological: Negative for weakness.  Hematological: Negative for adenopathy. Does not bruise/bleed easily.  Psychiatric/Behavioral: Negative for confusion.  All other systems reviewed and are negative.  Physical Exam Updated Vital Signs BP (!) 110/56 (BP Location: Right Arm)   Pulse 70   Temp 98.6 F (37 C) (Oral)   Resp 14   Ht 5\' 3"  (1.6 m)   Wt 71.7 kg   LMP 12/05/2017   SpO2 100%   BMI 27.99 kg/m   Physical Exam Vitals signs and nursing note reviewed.  Constitutional:      General: She is not in acute distress.    Appearance: She is well-developed. She is not diaphoretic.  HENT:     Head: Normocephalic and atraumatic.     Nose: Nose normal.     Mouth/Throat:     Mouth: Mucous membranes are moist.     Pharynx: Oropharynx is clear.  Cardiovascular:     Rate and Rhythm: Normal rate and regular rhythm.     Pulses: Normal pulses.  Pulmonary:     Effort: Pulmonary  effort is normal.     Breath sounds: Normal breath sounds.  Skin:    General: Skin is warm and dry.     Findings: Rash present.     Comments: Urticarial rash to bilateral upper extremities, 1 lesion noted to left breast and left posterior neck.  Neurological:     Mental Status: She is alert and oriented to person, place, and time.  Psychiatric:        Behavior: Behavior normal.      ED Treatments / Results  Labs (all labs ordered are listed, but only abnormal results are displayed) Labs Reviewed - No data to display  EKG None  Radiology No results found.  Procedures Procedures (including critical care time)  Medications Ordered in ED Medications  predniSONE (DELTASONE) tablet 60 mg (has no administration in time range)  famotidine (PEPCID) tablet 40 mg (has no administration in time range)     Initial Impression / Assessment and Plan / ED Course  I have reviewed the triage vital signs and the nursing notes.  Pertinent labs & imaging results that were available during my care of the patient were reviewed by me and considered in my medical decision making (see chart for details).  Clinical Course as of Jan 02 1305  Mon Dec 30, 639  4106 27 year old female with history of hives presents with complaint of rash.  Patient states she normally takes Benadryl for this however she was at work today and cannot take Benadryl as it would make her sleepy.  No known exposure to allergens.  Exam patient has a few hives noted to bilateral upper extremities, one lesion to her left breast and one lesion to left posterior neck.  Patient reports itching associated with these.  Patient was given dose of prednisone and Pepcid in the ER.  Advised to take Benadryl if not improving, return to ER for any worsening symptoms.   [LM]    Clinical Course User Index [LM] Tacy Learn, PA-C   Final Clinical Impressions(s) / ED Diagnoses   Final diagnoses:  Urticaria    ED Discharge Orders     None       Tacy Learn, PA-C 01/02/18 1306    Dorie Rank, MD 01/03/18 [phone]

## 2018-04-23 ENCOUNTER — Emergency Department (HOSPITAL_COMMUNITY)
Admission: EM | Admit: 2018-04-23 | Discharge: 2018-04-23 | Disposition: A | Discharge status: Home / Self Care | Payer: Medicaid Other | Attending: Emergency Medicine | Admitting: Emergency Medicine

## 2018-04-23 ENCOUNTER — Other Ambulatory Visit: Payer: Self-pay

## 2018-04-23 ENCOUNTER — Encounter (HOSPITAL_COMMUNITY): Payer: Self-pay | Admitting: *Deleted

## 2018-04-23 DIAGNOSIS — Z139 Encounter for screening, unspecified: Secondary | ICD-10-CM

## 2018-04-23 DIAGNOSIS — Z0279 Encounter for issue of other medical certificate: Secondary | ICD-10-CM | POA: Diagnosis not present

## 2018-04-23 DIAGNOSIS — R112 Nausea with vomiting, unspecified: Secondary | ICD-10-CM | POA: Diagnosis not present

## 2018-04-23 DIAGNOSIS — Z79899 Other long term (current) drug therapy: Secondary | ICD-10-CM | POA: Insufficient documentation

## 2018-04-23 NOTE — ED Triage Notes (Signed)
Pt sent home on Thursday for dizzy and emesis, pt denies having these symptoms presently. Pt here for a work note to be able to return to work, pt states she is symptom free at present.

## 2018-04-23 NOTE — ED Notes (Signed)
Pt e-signed but it did not capture. Verbalized consent

## 2018-04-23 NOTE — ED Provider Notes (Signed)
[facility] EMERGENCY DEPARTMENT Provider Note   CSN: [id number] Arrival date & time: 04/23/18  1333    History   Chief Complaint Chief Complaint  Patient presents with  . need note for work    HPI Deanna Sheppard is a 28 y.o. female.     HPI   Deanna Sheppard is a 28 y.o. female who presents to the Emergency Department requesting a return to work note.  States that she developed nausea and vomiting on Thursday while at work.  States that she ate some food at work that she feels made her sick.  She reports multiple episodes of vomiting Thursday and Friday, but none since.  No fever, abdominal pain or diarrhea.  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    Past Medical History:  Diagnosis Date  . Abdominal pain 11/15/2014  . 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 Chlamydia   . Contraception management 04/13/2012  . Cough 07/23/2014  . History of chlamydia 06/06/2013  . Irregular intermenstrual bleeding 11/15/2014  . No pertinent past medical history   . Pain with urination 11/15/2014  . Rib pain on left side 07/23/2014  . Sickle cell trait ([place])   . Urinary frequency 10/17/2012    Patient Active Problem List   Diagnosis Date Noted  . Chlamydial cervicitis and pregnancy 04/05/2017  . Encounter for insertion of mirena IUD 12/24/2015  . 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 Pain with urination 11/15/2014  . Irregular intermenstrual bleeding 11/15/2014  . Rib pain on left side 07/23/2014  . Cough 07/23/2014  . History of chlamydia 06/06/2013  . Urinary frequency 10/17/2012  . Sickle cell trait ([place]) 04/12/2012    Past Surgical History:  Procedure Laterality Date  . NO PAST SURGERIES       OB History    Gravida  1   Para  1   Term  1   Preterm  0   AB  0   Living  1     SAB  0   TAB  0   Ectopic  0   Multiple  0   Live Births  1            Home Medications    Prior to Admission medications   Medication Sig Start Date End Date Taking? Authorizing Provider  megestrol (MEGACE) 40 MG tablet 3x5d, 2x5d, then 1 daily to help control vaginal bleeding. Stop taking when bleeding stops. Patient not taking: Reported on 03/28/2017 03/01/16   Roma Schanz, CNM    Family History Family History  Adopted: Yes  Problem Relation Age of Onset  . Hypertension Mother   . Cancer Maternal Grandmother        breast   . Diabetes Maternal Grandmother     Social History Social History   Tobacco Use  . Smoking status: Never Smoker  . Smokeless tobacco: Never Used  Substance Use Topics  . Alcohol use: Yes    Comment: "every now and then"  . Drug use: No     Allergies   Bee venom; Azithromycin; and Levofloxacin   Review of Systems Review of Systems  Constitutional: Negative for chills, fatigue and fever.  Respiratory: Negative for cough and shortness of breath.   Cardiovascular: Negative for chest pain.  Gastrointestinal: Negative for abdominal pain, diarrhea, nausea and vomiting.  Genitourinary: Negative for decreased urine volume, dysuria and flank pain.  Musculoskeletal: Negative for arthralgias and myalgias.  Skin: Negative for rash.  Neurological: Negative for dizziness, weakness and numbness.     Physical Exam Updated Vital Signs BP 113/74 (BP Location: Right Arm)   Pulse (!) 56   Temp 98.1 F (36.7 C) (Oral)   Resp 15   Ht 5\' 4"  (1.626 m)   Wt 69.4 kg   SpO2 98%   BMI 26.26 kg/m   Physical Exam Vitals signs and nursing note reviewed.  Constitutional:      General: She is not in acute distress.    Appearance: Normal appearance. She is well-developed.  HENT:     Mouth/Throat:     Mouth: Mucous membranes are moist.  Neck:     Musculoskeletal: Normal range of motion.  Cardiovascular:     Rate and Rhythm: Normal rate and regular rhythm.   Pulmonary:     Effort: Pulmonary effort is normal. No respiratory distress.     Breath sounds: Normal breath sounds.  Chest:     Chest wall: No tenderness.  Abdominal:     General: There is no distension.     Palpations: Abdomen is soft.     Tenderness: There is no abdominal tenderness.  Musculoskeletal: Normal range of motion.  Lymphadenopathy:     Cervical: No cervical adenopathy.  Skin:    General: Skin is warm.     Findings: No rash.  Neurological:     General: No focal deficit present.     Mental Status: She is alert.     Motor: No abnormal muscle tone.     Coordination: Coordination normal.      ED Treatments / Results  Labs (all labs ordered are listed, but only abnormal results are displayed) Labs Reviewed - No data to display  EKG None  Radiology No results found.  Procedures Procedures (including critical care time)  Medications Ordered in ED Medications - No data to display   Initial Impression / Assessment and Plan / ED Course  I have reviewed the triage vital signs and the nursing notes.  Pertinent labs & imaging results that were available during my care of the patient were reviewed by me and considered in my medical decision making (see chart for details).        Pt currently asymptomatic.  Here for work note only.  Well appearing. Note provided     Final Clinical Impressions(s) / ED Diagnoses   Final diagnoses:  Encounter for medical screening examination    ED Discharge Orders    None       Kem Parkinson, PA-C 04/23/18 1422    Fredia Sorrow, MD 04/23/18 1500

## 2018-04-23 NOTE — Discharge Instructions (Addendum)
Keep your well hydrated.  Return if needed

## 2018-06-06 ENCOUNTER — Emergency Department (HOSPITAL_COMMUNITY)
Admission: EM | Admit: 2018-06-06 | Discharge: 2018-06-06 | Disposition: A | Discharge status: Home / Self Care | Payer: Medicaid Other | Attending: Emergency Medicine | Admitting: Emergency Medicine

## 2018-06-06 ENCOUNTER — Encounter (HOSPITAL_COMMUNITY): Payer: Self-pay | Admitting: *Deleted

## 2018-06-06 ENCOUNTER — Other Ambulatory Visit: Payer: Self-pay

## 2018-06-06 DIAGNOSIS — R112 Nausea with vomiting, unspecified: Secondary | ICD-10-CM | POA: Diagnosis present

## 2018-06-06 DIAGNOSIS — N898 Other specified noninflammatory disorders of vagina: Secondary | ICD-10-CM | POA: Diagnosis not present

## 2018-06-06 DIAGNOSIS — D573 Sickle-cell trait: Secondary | ICD-10-CM | POA: Insufficient documentation

## 2018-06-06 DIAGNOSIS — M791 Myalgia, unspecified site: Secondary | ICD-10-CM

## 2018-06-06 DIAGNOSIS — M7918 Myalgia, other site: Secondary | ICD-10-CM | POA: Insufficient documentation

## 2018-06-06 LAB — CBC WITH DIFFERENTIAL/PLATELET
Abs Immature Granulocytes: 0.03 10*3/uL (ref 0.00–0.07)
Basophils Absolute: 0 10*3/uL (ref 0.0–0.1)
Basophils Relative: 0 %
Eosinophils Absolute: 0.2 10*3/uL (ref 0.0–0.5)
Eosinophils Relative: 2 %
HCT: 39.8 % (ref 36.0–46.0)
Hemoglobin: 13.6 g/dL (ref 12.0–15.0)
Immature Granulocytes: 0 %
Lymphocytes Relative: 28 %
Lymphs Abs: 2.9 10*3/uL (ref 0.7–4.0)
MCH: 28.8 pg (ref 26.0–34.0)
MCHC: 34.2 g/dL (ref 30.0–36.0)
MCV: 84.1 fL (ref 80.0–100.0)
Monocytes Absolute: 0.6 10*3/uL (ref 0.1–1.0)
Monocytes Relative: 6 %
Neutro Abs: 6.4 10*3/uL (ref 1.7–7.7)
Neutrophils Relative %: 64 %
Platelets: 331 10*3/uL (ref 150–400)
RBC: 4.73 MIL/uL (ref 3.87–5.11)
RDW: 11.9 % (ref 11.5–15.5)
WBC: 10.1 10*3/uL (ref 4.0–10.5)
nRBC: 0 % (ref 0.0–0.2)

## 2018-06-06 LAB — WET PREP, GENITAL
Clue Cells Wet Prep HPF POC: NONE SEEN
Sperm: NONE SEEN
Trich, Wet Prep: NONE SEEN
Yeast Wet Prep HPF POC: NONE SEEN

## 2018-06-06 LAB — HCG, QUANTITATIVE, PREGNANCY: hCG, Beta Chain, Quant, S: <1 m[IU]/mL (ref <5)

## 2018-06-06 LAB — BASIC METABOLIC PANEL
Anion gap: 11 (ref 5–15)
BUN: 6 mg/dL (ref 6–20)
CO2: 22 mmol/L (ref 22–32)
Calcium: 8.9 mg/dL (ref 8.9–10.3)
Chloride: 106 mmol/L (ref 98–111)
Creatinine, Ser: 0.71 mg/dL (ref 0.44–1.00)
GFR calc Af Amer: >60 mL/min (ref >60)
GFR calc non Af Amer: >60 mL/min (ref >60)
Glucose, Bld: 100 mg/dL — ABNORMAL HIGH (ref 70–99)
Potassium: 3.5 mmol/L (ref 3.5–5.1)
Sodium: 139 mmol/L (ref 135–145)

## 2018-06-06 NOTE — Discharge Instructions (Addendum)
The testing today indicated that you are not pregnant.  There is no clear cause of the vaginal discharge.  We have done tests for STDs, which will be back in a couple of days.  You will be informed if you need to be treated.  Follow-up with your doctor for further contraceptive care, as scheduled, and as needed.  Return here, if needed, for problems.

## 2018-06-06 NOTE — ED Provider Notes (Signed)
[facility] EMERGENCY DEPARTMENT Provider Note   CSN: [id number] Arrival date & time: 06/06/18  0753    History   Chief Complaint Chief Complaint  Patient presents with  . Possible Pregnancy    HPI Deanna Sheppard is a 28 y.o. female.     HPI  He presents for evaluation of a constellation of symptoms, and pregnancy.  She states for the last 4 days she has had general achiness described as a burning sensation, nausea, vomiting, an episode of syncope, brown vaginal discharge, feeling warm, diarrhea and headaches.  She took home pregnancy test, multiple which were positive.  About 3 months ago she took a new type of injectable contraceptive, unknown type.  She has 1 living child from a single pregnancy.  No known sick contacts.  No known exposure to Covid-19.  She denies shortness of breath, cough, sore throat, rhinorrhea.  There are no other known modifying factors.  Past Medical History:  Diagnosis Date  . Abdominal pain 11/15/2014  . 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 Chlamydia   . Contraception management 04/13/2012  . Cough 07/23/2014  . History of chlamydia 06/06/2013  . Irregular intermenstrual bleeding 11/15/2014  . No pertinent past medical history   . Pain with urination 11/15/2014  . Rib pain on left side 07/23/2014  . Sickle cell trait ([place])   . Urinary frequency 10/17/2012    Patient Active Problem List   Diagnosis Date Noted  . Chlamydial cervicitis and pregnancy 04/05/2017  . Encounter for insertion of mirena IUD 12/24/2015  . 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 Pain with urination 11/15/2014  . Irregular intermenstrual bleeding 11/15/2014  . Rib pain on left side 07/23/2014  . Cough 07/23/2014  . History of chlamydia 06/06/2013  . Urinary frequency 10/17/2012  . Sickle cell trait ([place]) 04/12/2012    Past Surgical History:  Procedure Laterality Date  . NO PAST SURGERIES      OB History    Gravida  1   Para  1   Term  1   Preterm  0   AB  0   Living  1     SAB  0   TAB  0   Ectopic  0   Multiple  0   Live Births  1            Home Medications    Prior to Admission medications   Medication Sig Start Date End Date Taking? Authorizing Provider  megestrol (MEGACE) 40 MG tablet 3x5d, 2x5d, then 1 daily to help control vaginal bleeding. Stop taking when bleeding stops. Patient not taking: Reported on 03/28/2017 03/01/16   Roma Schanz, CNM    Family History Family History  Adopted: Yes  Problem Relation Age of Onset  . Hypertension Mother   . Cancer Maternal Grandmother        breast   . Diabetes Maternal Grandmother     Social History Social History   Tobacco Use  . Smoking status: Never Smoker  . Smokeless tobacco: Never Used  Substance Use Topics  . Alcohol use: Not Currently    Comment: "every now and then"  . Drug use: No     Allergies   Bee venom; Azithromycin; and Levofloxacin   Review of Systems Review of Systems  All other systems reviewed and are negative.    Physical Exam Updated Vital Signs BP 97/67   Pulse 73  Temp 98 F (36.7 C) (Oral)   Resp 19   Ht 5\' 4"  (1.626 m)   Wt 73.9 kg   LMP  (Within Months) Comment: pt reports several months ago due to birth control injection  SpO2 98%   BMI 27.98 kg/m   Physical Exam Vitals signs and nursing note reviewed.  Constitutional:      General: She is not in acute distress.    Appearance: She is well-developed. She is not ill-appearing, toxic-appearing or diaphoretic.  HENT:     Head: Normocephalic and atraumatic.     Right Ear: External ear normal.     Left Ear: External ear normal.  Eyes:     Conjunctiva/sclera: Conjunctivae normal.     Pupils: Pupils are equal, round, and reactive to light.  Neck:     Musculoskeletal: Normal range of motion and neck supple.     Trachea: Phonation normal.  Cardiovascular:     Rate and Rhythm: Normal  rate and regular rhythm.     Heart sounds: Normal heart sounds.  Pulmonary:     Effort: Pulmonary effort is normal.     Breath sounds: Normal breath sounds.  Abdominal:     General: There is no distension.     Palpations: Abdomen is soft. There is no mass.     Tenderness: There is no abdominal tenderness. There is no guarding.     Hernia: No hernia is present.  Genitourinary:    Comments: Normal external female genitalia.  Small amount of brown-colored vaginal discharge.  Cervix appears normal.  On bimanual examination she has mild diffuse tenderness in the pelvic region.  There is no palpable mass, or enlargement of the uterus. Musculoskeletal: Normal range of motion.  Skin:    General: Skin is warm and dry.  Neurological:     Mental Status: She is alert and oriented to person, place, and time.     Cranial Nerves: No cranial nerve deficit.     Sensory: No sensory deficit.     Motor: No abnormal muscle tone.     Coordination: Coordination normal.  Psychiatric:        Mood and Affect: Mood normal.        Behavior: Behavior normal.        Thought Content: Thought content normal.        Judgment: Judgment normal.      ED Treatments / Results  Labs (all labs ordered are listed, but only abnormal results are displayed) Labs Reviewed  WET PREP, GENITAL - Abnormal; Notable for the following components:      Result Value   WBC, Wet Prep HPF POC MODERATE (*)    All other components within normal limits  BASIC METABOLIC PANEL - Abnormal; Notable for the following components:   Glucose, Bld 100 (*)    All other components within normal limits  CBC WITH DIFFERENTIAL/PLATELET  HCG, QUANTITATIVE, PREGNANCY  RPR  HIV ANTIBODY (ROUTINE TESTING W REFLEX)  GC/CHLAMYDIA PROBE AMP ([place]) NOT AT [facility]    EKG None  Radiology No results found.  Procedures Procedures (including critical care time)  Medications Ordered in ED Medications - No data to display   Initial  Impression / Assessment and Plan / ED Course  I have reviewed the triage vital signs and the nursing notes.  Pertinent labs & imaging results that were available during my care of the patient were reviewed by me and considered in my medical decision making (see chart for details).  Clinical Course as of Jun 05 1017  Tue Jun 06, 2018  1011 normal  hCG, quantitative, pregnancy [EW]  1011 This test is normal  CBC with Differential [EW]  1011 Normal except moderate clue cells.  Wet prep, genital(!) [EW]  1011 Normal  Basic metabolic panel(!) [EW]    Clinical Course User Index [EW] Daleen Bo, MD        Patient Vitals for the past 24 hrs:  BP Temp Temp src Pulse Resp SpO2 Height Weight  06/06/18 0900 97/67 - - 73 19 98 % - -  06/06/18 0830 107/80 - - 73 20 100 % - -  06/06/18 0814 114/78 98 F (36.7 C) Oral 76 18 100 % - -  06/06/18 1610 - - - - - - 5\' 4"  (1.626 m) 73.9 kg    10:19 AM Reevaluation with update and discussion. After initial assessment and treatment, an updated evaluation reveals she is comfortable.  She does not feel she is at risk for STDs, she was offered treatment but declined.  Findings discussed and questions answered. Daleen Bo   Medical Decision Making: Nonspecific symptoms, with concern for pregnancy.  Quantitative hCG test is negative.  No clear evidence for bacterial, viral or metabolic processes.  Vaginal discharge is nonspecific.  There is no indication for further ED intervention, hospitalization or, treatment at discharge.  CRITICAL CARE-no Performed by: Daleen Bo  Nursing Notes Reviewed/ Care Coordinated Applicable Imaging Reviewed Interpretation of Laboratory Data incorporated into ED treatment  The patient appears reasonably screened and/or stabilized for discharge and I doubt any other medical condition or other [facility] requiring further screening, evaluation, or treatment in the ED at this time prior to discharge.  Plan: Home  Medications-OTC of choice; Home Treatments-rest, fluids; return here if the recommended treatment, does not improve the symptoms; Recommended follow up-PCP, PRN   Final Clinical Impressions(s) / ED Diagnoses   Final diagnoses:  Vaginal discharge  Myalgia    ED Discharge Orders    None       Daleen Bo, MD 06/06/18 1019

## 2018-06-06 NOTE — ED Triage Notes (Addendum)
Pt c/o passing out 2 days ago, brown vaginal discharge, headaches, abdominal cramping, vomiting x 1, diarrhea and feeling very warm since May 22. Pt reports she thinks she may be pregnant. Pt has taken home pregnancy tests which are positive. Pt reports she gets a birth control shot in her abdomen and it has been about 3 months since she had one. Pt reports she is now due for another one. Pt used to be on Depo but last time they switched her to a new birth control that is injected in the abdomen.

## 2018-06-07 LAB — GC/CHLAMYDIA PROBE AMP (~~LOC~~) NOT AT ARMC
Chlamydia: NEGATIVE
Neisseria Gonorrhea: NEGATIVE

## 2018-06-07 LAB — HIV ANTIBODY (ROUTINE TESTING W REFLEX): HIV Screen 4th Generation wRfx: NONREACTIVE

## 2018-06-07 LAB — RPR: RPR Ser Ql: NONREACTIVE

## 2018-09-10 ENCOUNTER — Emergency Department (HOSPITAL_COMMUNITY)
Admission: EM | Admit: 2018-09-10 | Discharge: 2018-09-10 | Disposition: A | Discharge status: Home / Self Care | Payer: Medicaid Other | Attending: Emergency Medicine | Admitting: Emergency Medicine

## 2018-09-10 ENCOUNTER — Other Ambulatory Visit: Payer: Self-pay

## 2018-09-10 ENCOUNTER — Emergency Department (HOSPITAL_COMMUNITY): Payer: Medicaid Other

## 2018-09-10 ENCOUNTER — Encounter (HOSPITAL_COMMUNITY): Payer: Self-pay | Admitting: Emergency Medicine

## 2018-09-10 DIAGNOSIS — N390 Urinary tract infection, site not specified: Secondary | ICD-10-CM

## 2018-09-10 DIAGNOSIS — K529 Noninfective gastroenteritis and colitis, unspecified: Secondary | ICD-10-CM | POA: Diagnosis not present

## 2018-09-10 DIAGNOSIS — R1110 Vomiting, unspecified: Secondary | ICD-10-CM | POA: Diagnosis present

## 2018-09-10 LAB — COMPREHENSIVE METABOLIC PANEL
ALT: 18 U/L (ref 0–44)
AST: 17 U/L (ref 15–41)
Albumin: 4.2 g/dL (ref 3.5–5.0)
Alkaline Phosphatase: 51 U/L (ref 38–126)
Anion gap: 6 (ref 5–15)
BUN: 9 mg/dL (ref 6–20)
CO2: 28 mmol/L (ref 22–32)
Calcium: 9.1 mg/dL (ref 8.9–10.3)
Chloride: 104 mmol/L (ref 98–111)
Creatinine, Ser: 0.64 mg/dL (ref 0.44–1.00)
GFR calc Af Amer: >60 mL/min (ref >60)
GFR calc non Af Amer: >60 mL/min (ref >60)
Glucose, Bld: 97 mg/dL (ref 70–99)
Potassium: 3.6 mmol/L (ref 3.5–5.1)
Sodium: 138 mmol/L (ref 135–145)
Total Bilirubin: 0.2 mg/dL — ABNORMAL LOW (ref 0.3–1.2)
Total Protein: 7.4 g/dL (ref 6.5–8.1)

## 2018-09-10 LAB — URINALYSIS, ROUTINE W REFLEX MICROSCOPIC
Bilirubin Urine: NEGATIVE
Glucose, UA: NEGATIVE mg/dL
Ketones, ur: NEGATIVE mg/dL
Nitrite: NEGATIVE
Protein, ur: NEGATIVE mg/dL
Specific Gravity, Urine: 1.011 (ref 1.005–1.030)
pH: 7 (ref 5.0–8.0)

## 2018-09-10 LAB — CBC
HCT: 42.7 % (ref 36.0–46.0)
Hemoglobin: 14.4 g/dL (ref 12.0–15.0)
MCH: 29.3 pg (ref 26.0–34.0)
MCHC: 33.7 g/dL (ref 30.0–36.0)
MCV: 87 fL (ref 80.0–100.0)
Platelets: 342 10*3/uL (ref 150–400)
RBC: 4.91 MIL/uL (ref 3.87–5.11)
RDW: 12 % (ref 11.5–15.5)
WBC: 11.1 10*3/uL — ABNORMAL HIGH (ref 4.0–10.5)
nRBC: 0 % (ref 0.0–0.2)

## 2018-09-10 LAB — HCG, QUANTITATIVE, PREGNANCY: hCG, Beta Chain, Quant, S: <1 m[IU]/mL (ref <5)

## 2018-09-10 LAB — POC URINE PREG, ED: Preg Test, Ur: NEGATIVE

## 2018-09-10 LAB — LIPASE, BLOOD: Lipase: 22 U/L (ref 11–51)

## 2018-09-10 MED ORDER — IOHEXOL 300 MG/ML  SOLN
100.0000 mL | Freq: Once | INTRAMUSCULAR | Status: AC | PRN
  Administered 2018-09-10: 100 mL via INTRAVENOUS

## 2018-09-10 MED ORDER — TRAMADOL HCL 50 MG PO TABS: 50.0000 mg | ORAL_TABLET | Freq: Four times a day (QID) | ORAL | 0 refills | Status: DC | PRN

## 2018-09-10 MED ORDER — TRAMADOL HCL 50 MG PO TABS
100.0000 mg | ORAL_TABLET | Freq: Once | ORAL | Status: AC
  Administered 2018-09-10: 100 mg via ORAL
  Filled 2018-09-10: qty 2

## 2018-09-10 MED ORDER — METRONIDAZOLE 500 MG PO TABS: 500.0000 mg | ORAL_TABLET | Freq: Two times a day (BID) | ORAL | 0 refills | Status: DC

## 2018-09-10 MED ORDER — PREDNISONE 20 MG PO TABS: 40.0000 mg | ORAL_TABLET | Freq: Every day | ORAL | 0 refills | Status: DC

## 2018-09-10 MED ORDER — SODIUM CHLORIDE 0.9% FLUSH: 3.0000 mL | Freq: Once | INTRAVENOUS | Status: DC

## 2018-09-10 MED ORDER — PREDNISONE 20 MG PO TABS
40.0000 mg | ORAL_TABLET | Freq: Once | ORAL | Status: AC
  Administered 2018-09-10: 40 mg via ORAL
  Filled 2018-09-10: qty 2

## 2018-09-10 MED ORDER — PROCHLORPERAZINE MALEATE 5 MG PO TABS
5.0000 mg | ORAL_TABLET | Freq: Four times a day (QID) | ORAL | Status: DC | PRN
  Administered 2018-09-10: 14:00:00 5 mg via ORAL
  Filled 2018-09-10: qty 1

## 2018-09-10 MED ORDER — CEPHALEXIN 500 MG PO CAPS
500.0000 mg | ORAL_CAPSULE | Freq: Once | ORAL | Status: AC
  Administered 2018-09-10: 14:00:00 500 mg via ORAL
  Filled 2018-09-10: qty 1

## 2018-09-10 MED ORDER — ONDANSETRON HCL 4 MG PO TABS: 4.0000 mg | ORAL_TABLET | Freq: Four times a day (QID) | ORAL | 0 refills | Status: DC

## 2018-09-10 MED ORDER — METRONIDAZOLE 500 MG PO TABS
500.0000 mg | ORAL_TABLET | Freq: Once | ORAL | Status: AC
  Administered 2018-09-10: 500 mg via ORAL
  Filled 2018-09-10: qty 1

## 2018-09-10 NOTE — ED Provider Notes (Signed)
[facility] EMERGENCY DEPARTMENT Provider Note   CSN: [id number] Arrival date & time: 09/10/18  [id number]     History   Chief Complaint Chief Complaint  Patient presents with  . Emesis    HPI Deanna Sheppard is a 28 y.o. female.     Patient is a 28 year old female who presents to the emergency department with a complaint of nausea vomiting.  The patient states that over the past 2 weeks she has been having episodes of vomiting anytime that she eats.  She is also had diarrhea.  She says on more than one occasion she has noticed some blood in the diarrhea.  She is unable to keep anything down right now.  The patient also complains of polyuria and lower back pain.  The patient is not aware of fever.  She is not had any injury or trauma to the abdomen, back, or pelvis.  No recent operations or procedures reported.  The patient denies known exposure to anyone with the COVID 19 virus.  She is not been doing any traveling recently.     Past Medical History:  Diagnosis Date  . Abdominal pain 11/15/2014  . 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 Chlamydia   . Contraception management 04/13/2012  . Cough 07/23/2014  . History of chlamydia 06/06/2013  . Irregular intermenstrual bleeding 11/15/2014  . No pertinent past medical history   . Pain with urination 11/15/2014  . Rib pain on left side 07/23/2014  . Sickle cell trait ([place])   . Urinary frequency 10/17/2012    Patient Active Problem List   Diagnosis Date Noted  . Chlamydial cervicitis and pregnancy 04/05/2017  . Encounter for insertion of mirena IUD 12/24/2015  . 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 Pain with urination 11/15/2014  . Irregular intermenstrual bleeding 11/15/2014  . Rib pain on left side 07/23/2014  . Cough 07/23/2014  . History of chlamydia 06/06/2013  . Urinary frequency 10/17/2012  . Sickle cell trait ([place]) 04/12/2012    Past Surgical  History:  Procedure Laterality Date  . NO PAST SURGERIES       OB History    Gravida  1   Para  1   Term  1   Preterm  0   AB  0   Living  1     SAB  0   TAB  0   Ectopic  0   Multiple  0   Live Births  1            Home Medications    Prior to Admission medications   Not on File    Family History Family History  Adopted: Yes  Problem Relation Age of Onset  . Hypertension Mother   . Cancer Maternal Grandmother        breast   . Diabetes Maternal Grandmother     Social History Social History   Tobacco Use  . Smoking status: Never Smoker  . Smokeless tobacco: Never Used  Substance Use Topics  . Alcohol use: Not Currently    Comment: "every now and then"  . Drug use: No     Allergies   Bee venom, Azithromycin, and Levofloxacin   Review of Systems Review of Systems  Constitutional: Negative for activity change, appetite change, chills and fever.  HENT: Negative for congestion, ear discharge, ear pain, facial swelling, nosebleeds, rhinorrhea, sneezing and tinnitus.   Eyes: Negative for photophobia, pain and  discharge.  Respiratory: Negative for cough, choking, shortness of breath and wheezing.   Cardiovascular: Negative for chest pain, palpitations and leg swelling.  Gastrointestinal: Positive for diarrhea, nausea and vomiting. Negative for abdominal pain, blood in stool and constipation.  Genitourinary: Positive for dysuria. Negative for difficulty urinating, flank pain, frequency and hematuria.  Musculoskeletal: Positive for back pain. Negative for gait problem, myalgias and neck pain.  Skin: Negative for color change, rash and wound.  Neurological: Negative for dizziness, seizures, syncope, facial asymmetry, speech difficulty, weakness and numbness.  Hematological: Negative for adenopathy. Does not bruise/bleed easily.  Psychiatric/Behavioral: Negative for agitation, confusion, hallucinations, self-injury and suicidal ideas. The patient  is not nervous/anxious.      Physical Exam Updated Vital Signs BP 110/70 (BP Location: Left Arm)   Pulse 78   Temp 98.3 F (36.8 C) (Oral)   Resp 16   Ht 5\' 4"  (1.626 m)   Wt 67.1 kg   LMP 09/03/2018   SpO2 100%   BMI 25.40 kg/m   Physical Exam Vitals signs and nursing note reviewed. Exam conducted with a chaperone present.  Constitutional:      Appearance: She is well-developed. She is not toxic-appearing.  HENT:     Head: Normocephalic.     Right Ear: Tympanic membrane and external ear normal.     Left Ear: Tympanic membrane and external ear normal.  Eyes:     General: Lids are normal.     Pupils: Pupils are equal, round, and reactive to light.  Neck:     Musculoskeletal: Normal range of motion and neck supple.     Vascular: No carotid bruit.  Cardiovascular:     Rate and Rhythm: Normal rate and regular rhythm.     Pulses: Normal pulses.     Heart sounds: Normal heart sounds.  Pulmonary:     Effort: No respiratory distress.     Breath sounds: Normal breath sounds.  Abdominal:     General: Abdomen is flat. Bowel sounds are normal. There is no distension.     Palpations: Abdomen is soft.     Tenderness: There is generalized abdominal tenderness. There is no guarding.  Genitourinary:    Rectum: Normal. Guaiac result negative. No tenderness, external hemorrhoid or internal hemorrhoid.  Musculoskeletal: Normal range of motion.  Lymphadenopathy:     Head:     Right side of head: No submandibular adenopathy.     Left side of head: No submandibular adenopathy.     Cervical: No cervical adenopathy.  Skin:    General: Skin is warm and dry.  Neurological:     Mental Status: She is alert and oriented to person, place, and time.     Cranial Nerves: No cranial nerve deficit.     Sensory: No sensory deficit.  Psychiatric:        Speech: Speech normal.      ED Treatments / Results  Labs (all labs ordered are listed, but only abnormal results are displayed) Labs  Reviewed  CBC - Abnormal; Notable for the following components:      Result Value   WBC 11.1 (*)    All other components within normal limits  URINALYSIS, ROUTINE W REFLEX MICROSCOPIC - Abnormal; Notable for the following components:   Hgb urine dipstick SMALL (*)    Leukocytes,Ua SMALL (*)    Bacteria, UA FEW (*)    All other components within normal limits  URINE CULTURE  LIPASE, BLOOD  COMPREHENSIVE METABOLIC PANEL  HCG, QUANTITATIVE,  PREGNANCY  POC URINE PREG, ED  POC OCCULT BLOOD, ED    EKG None  Radiology No results found.  Procedures Procedures (including critical care time)  Medications Ordered in ED Medications  sodium chloride flush (NS) 0.9 % injection 3 mL (3 mLs Intravenous Not Given 09/10/18 1023)     Initial Impression / Assessment and Plan / ED Course  I have reviewed the triage vital signs and the nursing notes.  Pertinent labs & imaging results that were available during my care of the patient were reviewed by me and considered in my medical decision making (see chart for details).          Final Clinical Impressions(s) / ED Diagnoses MDM  Vital signs within normal limits.  Pulse oximetry is 100% on room air.  Within normal limits by my interpretation.  Patient has generalized tenderness present on abdominal examination.  Pregnancy test is negative.  Lipase is within normal range at 22.  Comprehensive metabolic panel is nonacute.  Anion gap is normal at 6.  The complete blood count shows the white blood cells to be slightly elevated at 11,100.  There is no shift to the left.  Urine analysis shows a small amount of hemoglobin on the dipstick.  There is a small leuko-site esterase present.  There grew 21-50 white blood cells, and a few bacteria on the microscopic exam.  A culture has been sent down to the lab.  CT scan of the abdomen and pelvis shows diffuse mild thickening of the entire colon consistent with colitis.  The appendix is normal.   There is also colonic diverticulosis without diverticulitis noted.  There are some mesenteric lymph nodes present that is believed to be reactive.  I discussed these findings with the patient in terms of which he understands.  The patient will be placed on Keflex 4 times daily for the urinary tract infection.  The patient will be placed on metronidazole and prednisone for the colitis.  The patient is to be followed by Dr. Oneida Alar for GI evaluation and management.  Patient is in agreement with this plan.   Final diagnoses:  Colitis  Urinary tract infection without hematuria, site unspecified    ED Discharge Orders         Ordered    traMADol (ULTRAM) 50 MG tablet  Every 6 hours PRN     09/10/18 1348    metroNIDAZOLE (FLAGYL) 500 MG tablet  2 times daily     09/10/18 1348    predniSONE (DELTASONE) 20 MG tablet  Daily,   Status:  Discontinued     09/10/18 1348    ondansetron (ZOFRAN) 4 MG tablet  Every 6 hours     09/10/18 1348    predniSONE (DELTASONE) 20 MG tablet  Daily     09/10/18 1349           Lily Kocher, PA-C 09/15/18 1608    Julianne Rice, MD 09/19/18 1719

## 2018-09-10 NOTE — Discharge Instructions (Signed)
Your vital signs are within normal limits.  Your oxygen level is 100% on room air which is within normal limits.  Your examination shows a urinary tract infection.  Please use Keflex with breakfast, lunch, dinner, and at bedtime until all taken.  Please see your primary physician or urgent care or health department for recheck in about 7 to 10 days to ensure that this infection has resolved.  A culture has been sent to the lab to ensure that the medications being used are effective against the organism causing the infection.  Your CT scan shows a condition called colitis which is a thickening of the walls of your colon.  This is most likely the reason for your diarrhea, nausea, and abdominal pain.  Please see Dr.Fields-GI specialist for evaluation and management of this problem.  Please begin using Flagyl 2 times daily with food.  Please stay away from all alcoholic beverages or foods including mouthwash while taking this medication as it can make you very sick.  Please take it with a meal, do not take this on an empty stomach.  Also please use prednisone 2 tablets daily with a meal.  Use Tylenol extra strength for mild pain.  Use Ultram for more severe pain.  Use Zofran for nausea every 6 hours if needed.  Please see your primary physician or return to the emergency department if there are any changes in your condition, worsening of your symptoms, problems or concerns.

## 2018-09-10 NOTE — ED Triage Notes (Addendum)
Patient c/o nausea, vomiting, and diarrhea x2 week. Denies any fevers or abd pain. Patient states nausea and vomiting only with eating. Per patient low back pain and polyuria. Patient reports she gets an injection for birth control and "it's starting to wear off." Per patient period x4 days last week.

## 2018-09-12 LAB — POC OCCULT BLOOD, ED: Fecal Occult Bld: NEGATIVE

## 2018-09-13 LAB — URINE CULTURE
Culture: >=100000 — AB
Special Requests: NORMAL

## 2018-09-14 ENCOUNTER — Telehealth: Payer: Self-pay | Admitting: *Deleted

## 2018-09-14 NOTE — Telephone Encounter (Signed)
Post ED Visit - Positive Culture Follow-up: Successful Patient Follow-Up  Culture assessed and recommendations reviewed by:  []  Elenor Quinones, Pharm.D. []  Heide Guile, Pharm.D., BCPS AQ-ID []  Parks Neptune, Pharm.D., BCPS []  Alycia Rossetti, Pharm.D., BCPS []  Ravanna, Pharm.D., BCPS, AAHIVP []  Legrand Como, Pharm.D., BCPS, AAHIVP []  Salome Arnt, PharmD, BCPS []  Johnnette Gourd, PharmD, BCPS []  Hughes Better, PharmD, BCPS []  Leeroy Cha, PharmD  Positive urine culture  [x]  Patient discharged without antimicrobial prescription and treatment is now indicated []  Organism is resistant to prescribed ED discharge antimicrobial []  Patient with positive blood cultures  Changes discussed with ED provider: Carlisle Cater, PA-C New antibiotic prescription Keflex PO QID x 7 days Called to [facility], [place]  Contacted patient, date 09/14/2018, time [place] 09/14/2018, 12:02 PM

## 2018-12-08 ENCOUNTER — Other Ambulatory Visit: Payer: Self-pay

## 2018-12-08 DIAGNOSIS — Z20822 Contact with and (suspected) exposure to covid-19: Secondary | ICD-10-CM

## 2018-12-10 LAB — NOVEL CORONAVIRUS, NAA: SARS-CoV-2, NAA: NOT DETECTED

## 2019-03-07 ENCOUNTER — Emergency Department (HOSPITAL_COMMUNITY)
Admission: EM | Admit: 2019-03-07 | Discharge: 2019-03-07 | Disposition: A | Discharge status: Home / Self Care | Payer: Medicaid Other | Attending: Emergency Medicine | Admitting: Emergency Medicine

## 2019-03-07 ENCOUNTER — Encounter (HOSPITAL_COMMUNITY): Payer: Self-pay | Admitting: *Deleted

## 2019-03-07 ENCOUNTER — Other Ambulatory Visit: Payer: Self-pay

## 2019-03-07 DIAGNOSIS — R519 Headache, unspecified: Secondary | ICD-10-CM | POA: Diagnosis not present

## 2019-03-07 DIAGNOSIS — J029 Acute pharyngitis, unspecified: Secondary | ICD-10-CM | POA: Diagnosis not present

## 2019-03-07 DIAGNOSIS — R070 Pain in throat: Secondary | ICD-10-CM | POA: Diagnosis present

## 2019-03-07 DIAGNOSIS — Z5321 Procedure and treatment not carried out due to patient leaving prior to being seen by health care provider: Secondary | ICD-10-CM | POA: Insufficient documentation

## 2019-03-07 LAB — GROUP A STREP BY PCR: Group A Strep by PCR: DETECTED — AB

## 2019-03-07 NOTE — ED Notes (Signed)
Dr Roderic Palau made aware of pt's positive strep test, advised to contact pt and notified her of positive test and that she would need to be evaluation for treatment,  RN called pt and advised her of the positive strep test and that she needed to be evaluated. Pt expressed understanding.

## 2019-03-07 NOTE — ED Notes (Signed)
No answer in waiting room X1,  

## 2019-03-07 NOTE — ED Triage Notes (Signed)
Pt c/o sore throat, headache that started yesterday,

## 2019-03-08 ENCOUNTER — Other Ambulatory Visit: Payer: Self-pay

## 2019-03-08 ENCOUNTER — Ambulatory Visit
Admission: EM | Admit: 2019-03-08 | Discharge: 2019-03-08 | Disposition: A | Discharge status: Home / Self Care | Payer: Medicaid Other | Attending: Emergency Medicine | Admitting: Emergency Medicine

## 2019-03-08 ENCOUNTER — Telehealth: Payer: Medicaid Other

## 2019-03-08 DIAGNOSIS — J020 Streptococcal pharyngitis: Secondary | ICD-10-CM

## 2019-03-08 MED ORDER — AMOXICILLIN-POT CLAVULANATE 875-125 MG PO TABS: 1.0000 | ORAL_TABLET | Freq: Two times a day (BID) | ORAL | 0 refills | Status: DC

## 2019-03-08 NOTE — Discharge Instructions (Addendum)
Strep was positive.  Drink plenty of water and rest Prescribed Augmentin.  Take as directed and to completion.  Perform salt water gargles at home, throat lozenges Follow up with PCP if symptoms persist Return or go to ER if you have any new or worsening symptoms   Reviewed expectations re: course of current medical issues. Questions answered. Outlined signs and symptoms indicating need for more acute intervention. Patient verbalized understanding. After Visit Summary given.

## 2019-03-08 NOTE — ED Provider Notes (Signed)
[place]   [id number] 03/08/19 Arrival Time: [id number]  CC: Sore throat  SUBJECTIVE:  Deanna Sheppard is a 29 y.o. female who reports gradual  sore throat for 3 days.  Patient went to ED at [facility] last night and POCT strep test was positive but left AMA.  Therefore did not receive an antibiotic.  Patient  describes pain as constant and sharp in character  Has tried OTC Tylenol without relief.  Symptoms made worse with swallowing, but has normal PO intake.  Denies similar symptoms in the past.  Denies fever, chills, congestion, rhinorrhea, sneezing, cough, SOB, chest pain, abdominal pain, rash, changes in bowel or bladder function.    ROS: As per HPI. All other are negative.  Past Medical History:  Diagnosis Date  . Abdominal pain 11/15/2014  . 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 Chlamydia   . Contraception management 04/13/2012  . Cough 07/23/2014  . History of chlamydia 06/06/2013  . Irregular intermenstrual bleeding 11/15/2014  . No pertinent past medical history   . Pain with urination 11/15/2014  . Rib pain on left side 07/23/2014  . Sickle cell trait ([place])   . Urinary frequency 10/17/2012   Past Surgical History:  Procedure Laterality Date  . NO PAST SURGERIES     Allergies  Allergen Reactions  . Bee Venom Shortness Of Breath and Swelling  . Azithromycin Other (See Comments)    GI upset  . Levofloxacin Other (See Comments)    GI upset     Last Vitals:  Vitals Value Taken Time  BP 109/73 03/08/19 0929  Temp 98.2 F (36.8 C) 03/08/19 0929  Pulse 76 03/08/19 0929  Resp 15 03/08/19 0929  SpO2 98 % [id number]    Complications: No apparent complication  Social History   Socioeconomic History  . Marital status: Single    Spouse name: Not on file  . Number of children: Not on file  . Years of education: Not on file  . Highest education level: Not on file  Occupational History  . Not on file  Tobacco Use    . Smoking status: Never Smoker  . Smokeless tobacco: Never Used  Substance and Sexual Activity  . Alcohol use: Not Currently    Comment: "every now and then"  . Drug use: No  . Sexual activity: Not Currently    Birth control/protection: Injection  Other Topics Concern  . Not on file  Social History Narrative  . Not on file   Social Determinants of Health   Financial Resource Strain:   . Difficulty of Paying Living Expenses: Not on file  Food Insecurity:   . Worried About Charity fundraiser in the Last Year: Not on file  . Ran Out of Food in the Last Year: Not on file  Transportation Needs:   . Lack of Transportation (Medical): Not on file  . Lack of Transportation (Non-Medical): Not on file  Physical Activity:   . Days of Exercise per Week: Not on file  . Minutes of Exercise per Session: Not on file  Stress:   . Feeling of Stress : Not on file  Social Connections:   . Frequency of Communication with Friends and Family: Not on file  . Frequency of Social Gatherings with Friends and Family: Not on file  . Attends Religious Services: Not on file  . Active Member of Clubs or Organizations: Not on file  . Attends Archivist Meetings: Not  on file  . Marital Status: Not on file  Intimate Partner Violence:   . Fear of Current or Ex-Partner: Not on file  . Emotionally Abused: Not on file  . Physically Abused: Not on file  . Sexually Abused: Not on file   Family History  Adopted: Yes  Problem Relation Age of Onset  . Hypertension Mother   . Cancer Maternal Grandmother        breast   . Diabetes Maternal Grandmother     OBJECTIVE:  Vitals:   03/08/19 0929  BP: 109/73  Pulse: 76  Resp: 15  Temp: 98.2 F (36.8 C)  SpO2: 98%     General appearance: alert; no distress HEENT: EAC clear, TM pearly gray with visible landmarks; PERRL, EOM grossly intact; Nose without deformity, without rhinorrhea; throat with 3+ erythematous tonsils with white exudates ; uvula  midline Neck: supple with FROM; no lymphadenopathy Lungs: clear to auscultation bilaterally without adventitious breath sounds Heart: regular rate and rhythm.  Radial pulses 2+ symmetrical bilaterally Skin: reveals no rash; warm and dry Psychological: alert and cooperative; normal mood and affect  ASSESSMENT & PLAN:  1. Strep pharyngitis    POCT strep test was conducted at [facility] ED on 03/07/2019 result was positive for strepcoccus group A. POCT COVID-19 test was completed at [facility] ED on 03/07/2019 and result was negative. Augmentin will be prescribed for strep pharyngitis. Advised patient to take OTC Tylenol / ibuprofen for pain management. Return for worsening of symptoms  Meds ordered this encounter  Medications  . amoxicillin-clavulanate (AUGMENTIN) 875-125 MG tablet    Sig: Take 1 tablet by mouth every 12 (twelve) hours.    Dispense:  14 tablet    Refill:  0    Results for orders placed or performed in visit on 12/08/18  Novel Coronavirus, NAA ([facility])   Specimen: Nasopharyngeal(NP) swabs in vial transport medium   NASOPHARYNGE  TESTING  Result Value Ref Range   SARS-CoV-2, NAA Not Detected Not Detected   Labs Reviewed - No data to display  Strep was positive.  Drink plenty of water and rest Prescribed Augmentin.  Take as directed and to completion.  Perform salt water gargles at home, throat lozenges Follow up with PCP if symptoms persist Return or go to ER if you have any new or worsening symptoms   Reviewed expectations re: course of current medical issues. Questions answered. Outlined signs and symptoms indicating need for more acute intervention. Patient verbalized understanding. After Visit Summary given.   Emerson Monte, [place] 03/08/19 [phone]

## 2019-03-08 NOTE — ED Triage Notes (Signed)
Pt presents to UC w/ c/o sore throat x2 days.  Strep throat confimed yesterday in the [place], patient did not wait to bee seen because of wait times

## 2019-04-13 ENCOUNTER — Other Ambulatory Visit: Payer: Self-pay

## 2019-04-13 ENCOUNTER — Ambulatory Visit
Admission: EM | Admit: 2019-04-13 | Discharge: 2019-04-13 | Disposition: A | Discharge status: Home / Self Care | Payer: Medicaid Other | Attending: Emergency Medicine | Admitting: Emergency Medicine

## 2019-04-13 DIAGNOSIS — N926 Irregular menstruation, unspecified: Secondary | ICD-10-CM | POA: Diagnosis not present

## 2019-04-13 DIAGNOSIS — G4453 Primary thunderclap headache: Secondary | ICD-10-CM

## 2019-04-13 DIAGNOSIS — R109 Unspecified abdominal pain: Secondary | ICD-10-CM

## 2019-04-13 LAB — POCT URINALYSIS DIP (MANUAL ENTRY)
Bilirubin, UA: NEGATIVE
Glucose, UA: NEGATIVE mg/dL
Ketones, POC UA: NEGATIVE mg/dL
Nitrite, UA: NEGATIVE
Protein Ur, POC: NEGATIVE mg/dL
Spec Grav, UA: 1.02 (ref 1.010–1.025)
Urobilinogen, UA: 0.2 E.U./dL
pH, UA: 6.5 (ref 5.0–8.0)

## 2019-04-13 LAB — POCT URINE PREGNANCY: Preg Test, Ur: NEGATIVE

## 2019-04-13 MED ORDER — SUMATRIPTAN SUCCINATE 6 MG/0.5ML ~~LOC~~ SOLN
6.0000 mg | Freq: Once | SUBCUTANEOUS | Status: AC
  Administered 2019-04-13: 6 mg via SUBCUTANEOUS

## 2019-04-13 NOTE — Discharge Instructions (Addendum)
POCT urine pregnancy test was negative POCT urine analysis was inconclusive for UTI Urine culture sent.  We will call if results are abnormal Push fluids and get plenty of rest.   Imitrex IM was given in office Continue to take ibuprofen 800 mg as needed for pain Was advised to follow-up with OB/GYN Follow up with PCP if symptoms persists Return here or go to ER if you have any new or worsening symptoms such as fever, worsening abdominal pain, nausea/vomiting, flank pain

## 2019-04-13 NOTE — ED Provider Notes (Signed)
[facility]    CSN: [id number] Arrival date & time: 04/13/19  1515      History   Chief Complaint No chief complaint on file.   HPI Deanna Sheppard is a 29 y.o. female.   Who presented to the urgent care with a complaint of left-sided flank pain and irregular period.  She denies a precipitating event or recent sexual encounter.  Has not tried any OTC medication.  Her symptoms are made worse with urination.  Denies similar symptoms in the past.  LMP:02/25/19.  Reports she has a history of irregular menses.  Previously was put on contraception pills with symptom resolution.  Currently has stopped taking contraception pills.  She denies fever, chills, nausea, vomiting, abdominal pain,  hematuria, or incontinence  She is also complaining of headache for the past 2 weeks.  Denies any precipitating event.  Reported headache is located on her frontal lobe.  Has tried OTC ibuprofen with mild relief.  Reports symptoms made worse with light.  Denies similar symptoms in the past.  Denies fever, chills, nausea, vomiting, confusion, blurry vision, diplopia, facial droop.  The history is provided by the patient. No language interpreter was used.    Past Medical History:  Diagnosis Date  . Abdominal pain 11/15/2014  . 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 Chlamydia   . Contraception management 04/13/2012  . Cough 07/23/2014  . History of chlamydia 06/06/2013  . Irregular intermenstrual bleeding 11/15/2014  . No pertinent past medical history   . Pain with urination 11/15/2014  . Rib pain on left side 07/23/2014  . Sickle cell trait ([place])   . Urinary frequency 10/17/2012    Patient Active Problem List   Diagnosis Date Noted  . Chlamydial cervicitis and pregnancy 04/05/2017  . Encounter for insertion of mirena IUD 12/24/2015  . 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 Pain with urination 11/15/2014  .  Irregular intermenstrual bleeding 11/15/2014  . Rib pain on left side 07/23/2014  . Cough 07/23/2014  . History of chlamydia 06/06/2013  . Urinary frequency 10/17/2012  . Sickle cell trait ([place]) 04/12/2012    Past Surgical History:  Procedure Laterality Date  . NO PAST SURGERIES      OB History    Gravida  1   Para  1   Term  1   Preterm  0   AB  0   Living  1     SAB  0   TAB  0   Ectopic  0   Multiple  0   Live Births  1            Home Medications    Prior to Admission medications   Medication Sig Start Date End Date Taking? Authorizing Provider  amoxicillin-clavulanate (AUGMENTIN) 875-125 MG tablet Take 1 tablet by mouth every 12 (twelve) hours. 03/08/19   Nedim Oki, Darrelyn Hillock, FNP  metroNIDAZOLE (FLAGYL) 500 MG tablet Take 1 tablet (500 mg total) by mouth 2 (two) times daily. 09/10/18   Lily Kocher, PA-C  ondansetron (ZOFRAN) 4 MG tablet Take 1 tablet (4 mg total) by mouth every 6 (six) hours. 09/10/18   Lily Kocher, PA-C  predniSONE (DELTASONE) 20 MG tablet Take 2 tablets (40 mg total) by mouth daily. 09/10/18   Lily Kocher, PA-C  traMADol (ULTRAM) 50 MG tablet Take 1 tablet (50 mg total) by mouth every 6 (six) hours as needed. 09/10/18   Lily Kocher, PA-C  Family History Family History  Adopted: Yes  Problem Relation Age of Onset  . Hypertension Mother   . Cancer Maternal Grandmother        breast   . Diabetes Maternal Grandmother     Social History Social History   Tobacco Use  . Smoking status: Never Smoker  . Smokeless tobacco: Never Used  Substance Use Topics  . Alcohol use: Not Currently    Comment: "every now and then"  . Drug use: No     Allergies   Bee venom, Azithromycin, and Levofloxacin   Review of Systems Review of Systems  Constitutional: Negative.   HENT: Negative.   Respiratory: Negative.   Cardiovascular: Negative.   Gastrointestinal: Negative.   Genitourinary: Positive for flank pain and menstrual  problem.  Skin: Negative.   Neurological: Positive for headaches.  All other systems reviewed and are negative.    Physical Exam Triage Vital Signs ED Triage Vitals  Enc Vitals Group     BP 04/13/19 1525 115/75     Pulse Rate 04/13/19 1525 76     Resp 04/13/19 1525 18     Temp 04/13/19 1525 98.2 F (36.8 C)     Temp src --      SpO2 04/13/19 1525 97 %     Weight --      Height --      Head Circumference --      Peak Flow --      Pain Score 04/13/19 1523 5     Pain Loc --      Pain Edu? --      Excl. in [place]? --    No data found.  Updated Vital Signs BP 115/75   Pulse 76   Temp 98.2 F (36.8 C)   Resp 18   LMP 02/12/2019 (Approximate)   SpO2 97%   Visual Acuity Right Eye Distance:   Left Eye Distance:   Bilateral Distance:    Right Eye Near:   Left Eye Near:    Bilateral Near:     Physical Exam Vitals and nursing note reviewed.  Constitutional:      General: She is not in acute distress.    Appearance: Normal appearance. She is normal weight. She is not ill-appearing, toxic-appearing or diaphoretic.  Cardiovascular:     Rate and Rhythm: Normal rate and regular rhythm.     Pulses: Normal pulses.     Heart sounds: Normal heart sounds. No murmur. No friction rub. No gallop.   Pulmonary:     Effort: Pulmonary effort is normal. No respiratory distress.     Breath sounds: Normal breath sounds. No stridor. No wheezing, rhonchi or rales.  Chest:     Chest wall: No tenderness.  Abdominal:     General: Abdomen is flat. Bowel sounds are normal. There is no distension.     Palpations: Abdomen is soft. There is no mass.     Tenderness: There is abdominal tenderness in the left lower quadrant. There is no left CVA tenderness, guarding or rebound.     Hernia: No hernia is present.  Musculoskeletal:     Lumbar back: Tenderness present.     Comments: Left lower back tenderness  Neurological:     Mental Status: She is alert.      UC Treatments / Results   Labs (all labs ordered are listed, but only abnormal results are displayed) Labs Reviewed  POCT URINALYSIS DIP (MANUAL ENTRY) - Abnormal; Notable for the following components:  Result Value   Blood, UA trace-intact (*)    Leukocytes, UA Trace (*)    All other components within normal limits  POCT URINE PREGNANCY    EKG   Radiology No results found.  Procedures Procedures (including critical care time)  Medications Ordered in UC Medications - No data to display  Initial Impression / Assessment and Plan / UC Course  I have reviewed the triage vital signs and the nursing notes.  Pertinent labs & imaging results that were available during my care of the patient were reviewed by me and considered in my medical decision making (see chart for details).    Patient is stable for discharge.  POCT urine pregnancy was negative.  POCT urinalysis was inconclusive for UTI.  Sample will be sent for culture. Imitrex IM will be given in office follow-up possible migraine headache.  Was advised to continue to take OTC ibuprofen 800 as needed for pain.  Final Clinical Impressions(s) / UC Diagnoses   Final diagnoses:  Left flank pain  Primary thunderclap headache  Irregular menses     Discharge Instructions     POCT urine pregnancy test was negative POCT urine analysis was inconclusive for UTI Urine culture sent.  We will call if results are abnormal Push fluids and get plenty of rest.   Imitrex IM was given in office Continue to take ibuprofen 800 mg as needed for pain Was advised to follow-up with OB/GYN Follow up with PCP if symptoms persists Return here or go to ER if you have any new or worsening symptoms such as fever, worsening abdominal pain, nausea/vomiting, flank pain     ED Prescriptions    None     PDMP not reviewed this encounter.   Emerson Monte, FNP 04/13/19 1601

## 2019-04-13 NOTE — ED Triage Notes (Signed)
Pt has multiple complaints, states that she is unsure if pregnant has had irregular period, also has migraine, left side flank pain and knot on right wrist

## 2019-06-13 ENCOUNTER — Ambulatory Visit (INDEPENDENT_AMBULATORY_CARE_PROVIDER_SITE_OTHER): Payer: Medicaid Other | Admitting: *Deleted

## 2019-06-13 ENCOUNTER — Encounter: Payer: Self-pay | Admitting: *Deleted

## 2019-06-13 VITALS — BP 109/72 | HR 74 | Ht 64.0 in | Wt 171.0 lb

## 2019-06-13 DIAGNOSIS — Z3202 Encounter for pregnancy test, result negative: Secondary | ICD-10-CM | POA: Diagnosis not present

## 2019-06-13 LAB — POCT URINE PREGNANCY: Preg Test, Ur: NEGATIVE

## 2019-06-13 NOTE — Progress Notes (Addendum)
   NURSE VISIT- PREGNANCY CONFIRMATION   SUBJECTIVE:  Deanna Sheppard is a 29 y.o. G1P1001 female at Unknown by certain LMP of Patient's last menstrual period was 05/08/2019 (exact date). Here for pregnancy confirmation.  Home pregnancy test: positive x3. She reports no complaints.  She is not taking prenatal vitamins.    OBJECTIVE:  BP 109/72 (BP Location: Left Arm, Patient Position: Sitting, Cuff Size: Normal)   Pulse 74   Ht 5\' 4"  (1.626 m)   Wt 171 lb (77.6 kg)   LMP 05/08/2019 (Exact Date)   BMI 29.35 kg/m   Appears well, in no apparent distress OB History  Gravida Para Term Preterm AB Living  1 1 1  0 0 1  SAB TAB Ectopic Multiple Live Births  0 0 0 0 1    # Outcome Date GA Lbr Len/2nd Weight Sex Delivery Anes PTL Lv  1 Term 06/04/11 [redacted]w[redacted]d 07:32 / 01:19 7 lb 5.1 oz (3.32 kg) M Vag-Spont EPI  LIV    Results for orders placed or performed in visit on 06/13/19 (from the past 24 hour(s))  POCT urine pregnancy   Collection Time: 06/13/19  9:39 AM  Result Value Ref Range   Preg Test, Ur Negative Negative    ASSESSMENT: Negative pregnancy test    PLAN: Schedule for dating ultrasound not scheduled at this time.  Prenatal vitamins: not at this time.    Nausea medicines: not currently needed   OB packet given: No   Sending patient for blood work.   Rolena Infante  06/13/2019 9:39 AM   Chart reviewed for nurse visit. Agree with plan of care.  Roma Schanz, [place] 06/13/2019 11:14 AM

## 2019-06-14 ENCOUNTER — Telehealth: Payer: Self-pay | Admitting: Adult Health

## 2019-06-14 LAB — BETA HCG QUANT (REF LAB): hCG Quant: <1 m[IU]/mL

## 2019-06-14 NOTE — Telephone Encounter (Signed)
Telephoned patient at home number and advised patient lab work showed not pregnant. Patient voiced understanding.

## 2019-06-14 NOTE — Telephone Encounter (Signed)
Would like someone to review lab results with her 

## 2019-07-23 ENCOUNTER — Ambulatory Visit: Payer: Medicaid Other | Admitting: Orthopedic Surgery

## 2019-09-25 ENCOUNTER — Other Ambulatory Visit: Payer: Medicaid Other

## 2019-10-08 ENCOUNTER — Other Ambulatory Visit (HOSPITAL_COMMUNITY)
Admission: RE | Admit: 2019-10-08 | Discharge: 2019-10-08 | Disposition: A | Discharge status: Home / Self Care | Payer: Medicaid Other | Source: Ambulatory Visit | Attending: Obstetrics & Gynecology | Admitting: Obstetrics & Gynecology

## 2019-10-08 ENCOUNTER — Other Ambulatory Visit (INDEPENDENT_AMBULATORY_CARE_PROVIDER_SITE_OTHER): Payer: Medicaid Other | Admitting: *Deleted

## 2019-10-08 DIAGNOSIS — Z113 Encounter for screening for infections with a predominantly sexual mode of transmission: Secondary | ICD-10-CM | POA: Diagnosis not present

## 2019-10-08 NOTE — Progress Notes (Signed)
   NURSE VISIT- VAGINITIS/STD/POC  SUBJECTIVE:  Deanna Sheppard is a 29 y.o. G1P1001 GYN patientfemale here for a vaginal swab for STD screen.  She reports the following symptoms: none for 0 days. Denies abnormal vaginal bleeding, significant pelvic pain, fever, or UTI symptoms.  OBJECTIVE:  There were no vitals taken for this visit.  Appears well, in no apparent distress  ASSESSMENT: Vaginal swab for STD screen  PLAN: Self-collected vaginal probe for Gonorrhea, Chlamydia, Trichomonas sent to lab Treatment: to be determined once results are received Follow-up as needed if symptoms persist/worsen, or new symptoms develop  Deanna Sheppard  10/08/2019 3:15 PM

## 2019-10-08 NOTE — Progress Notes (Signed)
Chart reviewed for nurse visit. Agree with plan of care.  Estill Dooms, NP 10/08/2019 5:17 PM

## 2019-10-10 ENCOUNTER — Telehealth: Payer: Self-pay | Admitting: Adult Health

## 2019-10-10 ENCOUNTER — Encounter: Payer: Self-pay | Admitting: Adult Health

## 2019-10-10 DIAGNOSIS — A599 Trichomoniasis, unspecified: Secondary | ICD-10-CM

## 2019-10-10 HISTORY — DX: Trichomoniasis, unspecified: A59.9

## 2019-10-10 LAB — CERVICOVAGINAL ANCILLARY ONLY
Chlamydia: NEGATIVE
Comment: NEGATIVE
Comment: NEGATIVE
Comment: NORMAL
Neisseria Gonorrhea: NEGATIVE
Trichomonas: POSITIVE — AB

## 2019-10-10 MED ORDER — METRONIDAZOLE 500 MG PO TABS: 500.0000 mg | ORAL_TABLET | Freq: Two times a day (BID) | ORAL | 0 refills | Status: DC

## 2019-10-10 NOTE — Telephone Encounter (Signed)
Left message that vaginal swab negative for GC and chlamydia but + for trich which is a parasite that is sexually transmitted, and that Rx for flagyl sent to [place], and that partner needs to be treated too, need his name and DOb and durg store with any allergies, then no sex and make appt for self swab in about 2 weeks to make trich is gone

## 2019-10-20 ENCOUNTER — Other Ambulatory Visit: Payer: Self-pay

## 2019-10-20 ENCOUNTER — Encounter (HOSPITAL_COMMUNITY): Payer: Self-pay

## 2019-10-20 ENCOUNTER — Emergency Department (HOSPITAL_COMMUNITY)
Admission: EM | Admit: 2019-10-20 | Discharge: 2019-10-20 | Disposition: A | Discharge status: Home / Self Care | Payer: Medicaid Other | Attending: Emergency Medicine | Admitting: Emergency Medicine

## 2019-10-20 DIAGNOSIS — E876 Hypokalemia: Secondary | ICD-10-CM

## 2019-10-20 DIAGNOSIS — R112 Nausea with vomiting, unspecified: Secondary | ICD-10-CM | POA: Diagnosis present

## 2019-10-20 DIAGNOSIS — K2920 Alcoholic gastritis without bleeding: Secondary | ICD-10-CM

## 2019-10-20 LAB — COMPREHENSIVE METABOLIC PANEL
ALT: 23 U/L (ref 0–44)
AST: 23 U/L (ref 15–41)
Albumin: 4.1 g/dL (ref 3.5–5.0)
Alkaline Phosphatase: 43 U/L (ref 38–126)
Anion gap: 7 (ref 5–15)
BUN: 7 mg/dL (ref 6–20)
CO2: 26 mmol/L (ref 22–32)
Calcium: 9 mg/dL (ref 8.9–10.3)
Chloride: 104 mmol/L (ref 98–111)
Creatinine, Ser: 0.62 mg/dL (ref 0.44–1.00)
GFR, Estimated: >60 mL/min (ref >60)
Glucose, Bld: 101 mg/dL — ABNORMAL HIGH (ref 70–99)
Potassium: 3.1 mmol/L — ABNORMAL LOW (ref 3.5–5.1)
Sodium: 137 mmol/L (ref 135–145)
Total Bilirubin: 0.7 mg/dL (ref 0.3–1.2)
Total Protein: 7.4 g/dL (ref 6.5–8.1)

## 2019-10-20 LAB — CBC
HCT: 38.4 % (ref 36.0–46.0)
Hemoglobin: 13.2 g/dL (ref 12.0–15.0)
MCH: 29.1 pg (ref 26.0–34.0)
MCHC: 34.4 g/dL (ref 30.0–36.0)
MCV: 84.6 fL (ref 80.0–100.0)
Platelets: 376 10*3/uL (ref 150–400)
RBC: 4.54 MIL/uL (ref 3.87–5.11)
RDW: 12.5 % (ref 11.5–15.5)
WBC: 12.3 10*3/uL — ABNORMAL HIGH (ref 4.0–10.5)
nRBC: 0 % (ref 0.0–0.2)

## 2019-10-20 LAB — HCG, SERUM, QUALITATIVE: Preg, Serum: NEGATIVE

## 2019-10-20 LAB — ETHANOL: Alcohol, Ethyl (B): <10 mg/dL (ref <10)

## 2019-10-20 LAB — LIPASE, BLOOD: Lipase: 25 U/L (ref 11–51)

## 2019-10-20 MED ORDER — ONDANSETRON HCL 4 MG/2ML IJ SOLN
4.0000 mg | Freq: Once | INTRAMUSCULAR | Status: AC
  Administered 2019-10-20: 4 mg via INTRAVENOUS
  Filled 2019-10-20: qty 2

## 2019-10-20 MED ORDER — FAMOTIDINE IN NACL 20-0.9 MG/50ML-% IV SOLN
20.0000 mg | Freq: Once | INTRAVENOUS | Status: AC
  Administered 2019-10-20: 20 mg via INTRAVENOUS
  Filled 2019-10-20: qty 50

## 2019-10-20 MED ORDER — POTASSIUM CHLORIDE CRYS ER 20 MEQ PO TBCR
40.0000 meq | EXTENDED_RELEASE_TABLET | Freq: Once | ORAL | Status: AC
  Administered 2019-10-20: 40 meq via ORAL
  Filled 2019-10-20: qty 2

## 2019-10-20 MED ORDER — ONDANSETRON 8 MG PO TBDP: 8.0000 mg | ORAL_TABLET | Freq: Three times a day (TID) | ORAL | 0 refills | Status: DC | PRN

## 2019-10-20 MED ORDER — LACTATED RINGERS IV BOLUS
1000.0000 mL | Freq: Once | INTRAVENOUS | Status: AC
  Administered 2019-10-20: 1000 mL via INTRAVENOUS

## 2019-10-20 MED ORDER — FAMOTIDINE 20 MG PO TABS: 20.0000 mg | ORAL_TABLET | Freq: Two times a day (BID) | ORAL | 0 refills | Status: DC

## 2019-10-20 NOTE — Discharge Instructions (Addendum)
Avoid drinking alcohol.  Take the medications as prescribed.  Return as needed for worsening symptoms.  Drink plenty of fluids to stay hydrated

## 2019-10-20 NOTE — ED Provider Notes (Signed)
[facility] EMERGENCY DEPARTMENT Provider Note   CSN: [id number] Arrival date & time: 10/20/19  1230     History Chief Complaint  Patient presents with  . Emesis    Deanna Sheppard is a 29 y.o. female.  HPI   Patient thinks she might have alcohol poisoning.  Patient states she had 2 alcoholic beverages last evening that she mixed together.  Normally she does not combine these drinks.  Patient states since drinking last night she has had numerous episodes of nausea and vomiting.  She cannot keep anything down.  She continues to feel nauseated.  She is also having pain in her upper abdomen.  Patient denies any fevers or chills.  No diarrhea.  No hematemesis or blood in her stool  Past Medical History:  Diagnosis Date  . Abdominal pain 11/15/2014  . 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 Chlamydia   . Contraception management 04/13/2012  . Cough 07/23/2014  . History of chlamydia 06/06/2013  . Irregular intermenstrual bleeding 11/15/2014  . No pertinent past medical history   . Pain with urination 11/15/2014  . Rib pain on left side 07/23/2014  . Sickle cell trait ([place])   . Trichimoniasis 10/10/2019   10/10/19 treated with flagyl  . Urinary frequency 10/17/2012    Patient Active Problem List   Diagnosis Date Noted  . Trichimoniasis 10/10/2019  . Chlamydial cervicitis and pregnancy 04/05/2017  . Encounter for insertion of mirena IUD 12/24/2015  . 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 Pain with urination 11/15/2014  . Irregular intermenstrual bleeding 11/15/2014  . Rib pain on left side 07/23/2014  . Cough 07/23/2014  . History of chlamydia 06/06/2013  . Urinary frequency 10/17/2012  . Sickle cell trait ([place]) 04/12/2012    Past Surgical History:  Procedure Laterality Date  . NO PAST SURGERIES       OB History    Gravida  1   Para  1   Term  1   Preterm  0   AB  0   Living  1     SAB   0   TAB  0   Ectopic  0   Multiple  0   Live Births  1           Family History  Adopted: Yes  Problem Relation Age of Onset  . Hypertension Mother   . Cancer Maternal Grandmother        breast   . Diabetes Maternal Grandmother     Social History   Tobacco Use  . Smoking status: Never Smoker  . Smokeless tobacco: Never Used  Vaping Use  . Vaping Use: Never used  Substance Use Topics  . Alcohol use: Yes  . Drug use: Never    Home Medications Prior to Admission medications   Medication Sig Start Date End Date Taking? Authorizing Provider  famotidine (PEPCID) 20 MG tablet Take 1 tablet (20 mg total) by mouth 2 (two) times daily. 10/20/19   Dorie Rank, MD  metroNIDAZOLE (FLAGYL) 500 MG tablet Take 1 tablet (500 mg total) by mouth 2 (two) times daily. 10/10/19   Estill Dooms, NP  ondansetron (ZOFRAN ODT) 8 MG disintegrating tablet Take 1 tablet (8 mg total) by mouth every 8 (eight) hours as needed for nausea or vomiting. 10/20/19   Dorie Rank, MD    Allergies    Bee venom, Azithromycin, and Levofloxacin  Review of Systems  Review of Systems  All other systems reviewed and are negative.   Physical Exam Updated Vital Signs BP 104/67   Pulse 67   Temp 98.8 F (37.1 C) (Oral)   Resp 16   Ht 1.626 m (5\' 4" )   Wt 74.8 kg   SpO2 97%   BMI 28.32 kg/m   Physical Exam Vitals and nursing note reviewed.  Constitutional:      General: She is not in acute distress.    Appearance: She is well-developed.  HENT:     Head: Normocephalic and atraumatic.     Right Ear: External ear normal.     Left Ear: External ear normal.  Eyes:     General: No scleral icterus.       Right eye: No discharge.        Left eye: No discharge.     Conjunctiva/sclera: Conjunctivae normal.  Neck:     Trachea: No tracheal deviation.  Cardiovascular:     Rate and Rhythm: Normal rate and regular rhythm.  Pulmonary:     Effort: Pulmonary effort is normal. No respiratory  distress.     Breath sounds: Normal breath sounds. No stridor. No wheezing or rales.  Abdominal:     General: Bowel sounds are normal. There is no distension.     Palpations: Abdomen is soft.     Tenderness: There is abdominal tenderness in the epigastric area. There is no guarding or rebound.  Musculoskeletal:        General: No tenderness.     Cervical back: Neck supple.  Skin:    General: Skin is warm and dry.     Findings: No rash.  Neurological:     Mental Status: She is alert.     Cranial Nerves: No cranial nerve deficit (no facial droop, extraocular movements intact, no slurred speech).     Sensory: No sensory deficit.     Motor: No abnormal muscle tone or seizure activity.     Coordination: Coordination normal.     ED Results / Procedures / Treatments   Labs (all labs ordered are listed, but only abnormal results are displayed) Labs Reviewed  CBC - Abnormal; Notable for the following components:      Result Value   WBC 12.3 (*)    All other components within normal limits  COMPREHENSIVE METABOLIC PANEL - Abnormal; Notable for the following components:   Potassium 3.1 (*)    Glucose, Bld 101 (*)    All other components within normal limits  LIPASE, BLOOD  HCG, SERUM, QUALITATIVE  ETHANOL    EKG None  Radiology No results found.  Procedures Procedures (including critical care time)  Medications Ordered in ED Medications  potassium chloride SA (KLOR-CON) CR tablet 40 mEq (has no administration in time range)  lactated ringers bolus 1,000 mL (0 mLs Intravenous Stopped 10/20/19 1455)  ondansetron (ZOFRAN) injection 4 mg (4 mg Intravenous Given 10/20/19 1312)  famotidine (PEPCID) IVPB 20 mg premix (0 mg Intravenous Stopped 10/20/19 1455)    ED Course  I have reviewed the triage vital signs and the nursing notes.  Pertinent labs & imaging results that were available during my care of the patient were reviewed by me and considered in my medical decision  making (see chart for details).  Clinical Course as of Oct 20 1522  Sat Oct 20, 2019  1503 Labs reviewed.  White blood cell count slightly elevated.  Electrolyte panel shows mild hypokalemia.  Lipase is normal.     [  JK]    Clinical Course User Index [JK] Dorie Rank, MD   MDM Rules/Calculators/A&P                          Patient presented to ED for evaluation of nausea vomiting associated with alcohol use last evening.  Patient is not intoxicated.  She did have hypokalemia likely related to her nausea and vomiting.  Patient was treated with IV fluid and antiemetics.  Her symptoms improved significantly.  No findings to suggest obstruction.  No hepatitis or pancreatitis.  Plan on discharge home with antacids and antinausea medications. Final Clinical Impression(s) / ED Diagnoses Final diagnoses:  Acute alcoholic gastritis without hemorrhage  Hypokalemia    Rx / DC Orders ED Discharge Orders         Ordered    ondansetron (ZOFRAN ODT) 8 MG disintegrating tablet  Every 8 hours PRN        10/20/19 1524    famotidine (PEPCID) 20 MG tablet  2 times daily        10/20/19 1524           Dorie Rank, MD 10/20/19 1525

## 2019-10-20 NOTE — ED Triage Notes (Signed)
Pt to er room number 87, pt states that she is here because she thinks that she has etoh poisoning, states that she mixed a "smash" with a "jamacian me crazy", pt states that she had these last night and after she couldn't sleep and then started vomiting a lot.  States that she still feels like she wants to vomit this am.

## 2019-11-08 ENCOUNTER — Other Ambulatory Visit: Payer: Medicaid Other

## 2019-11-08 DIAGNOSIS — Z20822 Contact with and (suspected) exposure to covid-19: Secondary | ICD-10-CM

## 2019-11-09 LAB — NOVEL CORONAVIRUS, NAA: SARS-CoV-2, NAA: NOT DETECTED

## 2019-11-09 LAB — SARS-COV-2, NAA 2 DAY TAT

## 2019-11-21 ENCOUNTER — Other Ambulatory Visit: Payer: Self-pay

## 2019-11-21 ENCOUNTER — Encounter: Payer: Self-pay | Admitting: Advanced Practice Midwife

## 2019-11-21 ENCOUNTER — Ambulatory Visit (INDEPENDENT_AMBULATORY_CARE_PROVIDER_SITE_OTHER): Payer: Medicaid Other | Admitting: Advanced Practice Midwife

## 2019-11-21 VITALS — BP 107/70 | HR 75 | Ht 64.0 in | Wt 176.0 lb

## 2019-11-21 DIAGNOSIS — N3000 Acute cystitis without hematuria: Secondary | ICD-10-CM | POA: Insufficient documentation

## 2019-11-21 DIAGNOSIS — R350 Frequency of micturition: Secondary | ICD-10-CM

## 2019-11-21 LAB — POCT URINALYSIS DIPSTICK
Blood, UA: NEGATIVE
Glucose, UA: NEGATIVE
Ketones, UA: NEGATIVE
Leukocytes, UA: NEGATIVE
Nitrite, UA: POSITIVE
Protein, UA: NEGATIVE

## 2019-11-21 MED ORDER — NITROFURANTOIN MONOHYD MACRO 100 MG PO CAPS: 100.0000 mg | ORAL_CAPSULE | Freq: Two times a day (BID) | ORAL | 0 refills | Status: DC

## 2019-11-21 NOTE — Progress Notes (Signed)
   GYN VISIT Patient name: Deanna Sheppard MRN [id number]  Date of birth: 1990/04/14 Chief Complaint:   Urinary Frequency (pain in low belly)  History of Present Illness:   Deanna Sheppard is a 29 y.o. G21P1001 African American female being seen today for low abd discomfort and urinary frequency during the night since the beginning of the month, but getting worse recently. Denies fever or back pain.    Depression screen [facility] 2/9 09/07/2016 11/17/2015  Decreased Interest 2 0  Down, Depressed, Hopeless 0 1  PHQ - 2 Score 2 1  Altered sleeping 3 -  Tired, decreased energy 3 -  Change in appetite 2 -  Feeling bad or failure about yourself  1 -  Trouble concentrating 0 -  Moving slowly or fidgety/restless 2 -  Suicidal thoughts 0 -  PHQ-9 Score 13 -    Patient's last menstrual period was 10/18/2019 (approximate). The current method of family planning is abstinence.  Last pap March 2019. Results were:  normal except for +chlamydia Review of Systems:   Pertinent items are noted in HPI Denies fever/chills, dizziness, headaches, visual disturbances, fatigue, shortness of breath, chest pain, abdominal pain, vomiting, abnormal vaginal discharge/itching/odor/irritation, problems with periods, bowel movements, urination, or intercourse unless otherwise stated above.  Pertinent History Reviewed:  Reviewed past medical,surgical, social, obstetrical and family history.  Reviewed problem list, medications and allergies. Physical Assessment:   Vitals:   11/21/19 1421  BP: 107/70  Pulse: 75  Weight: 176 lb (79.8 kg)  Height: 5\' 4"  (1.626 m)  Body mass index is 30.21 kg/m.       Physical Examination:   General appearance: alert, well appearing, and in no distress  Mental status: alert, oriented to person, place, and time  Skin: warm & dry   Cardiovascular: normal heart rate noted  Respiratory: normal respiratory effort, no distress  Abdomen: soft, non-tender; neg CVAT   Pelvic:  examination not indicated  Extremities: no edema   Chaperone: N/A    Results for orders placed or performed in visit on 11/21/19 (from the past 24 hour(s))  POCT Urinalysis Dipstick   Collection Time: 11/21/19  2:25 PM  Result Value Ref Range   Color, UA     Clarity, UA     Glucose, UA Negative Negative   Bilirubin, UA     Ketones, UA neg    Spec Grav, UA     Blood, UA neg    pH, UA     Protein, UA Negative Negative   Urobilinogen, UA     Nitrite, UA positive    Leukocytes, UA Negative Negative   Appearance     Odor      Assessment & Plan:  1) Acute cystitis> rx Macrobid x 7d and urine to culture   Meds:  Meds ordered this encounter  Medications  . nitrofurantoin, macrocrystal-monohydrate, (MACROBID) 100 MG capsule    Sig: Take 1 capsule (100 mg total) by mouth 2 (two) times daily.    Dispense:  14 capsule    Refill:  0    Order Specific Question:   Supervising Provider    Answer:   Tania Ade H [[id number]]    Orders Placed This Encounter  Procedures  . Urine Culture  . POCT Urinalysis Dipstick    Return for March 2022, Pap & Physical.  Myrtis Ser [facility] 11/21/2019 2:38 PM

## 2019-11-21 NOTE — Patient Instructions (Signed)
Urinary Tract Infection, Adult A urinary tract infection (UTI) is an infection of any part of the urinary tract. The urinary tract includes:  The kidneys.  The ureters.  The bladder.  The urethra. These organs make, store, and get rid of pee (urine) in the body. What are the causes? This is caused by germs (bacteria) in your genital area. These germs grow and cause swelling (inflammation) of your urinary tract. What increases the risk? You are more likely to develop this condition if:  You have a small, thin tube (catheter) to drain pee.  You cannot control when you pee or poop (incontinence).  You are female, and: ? You use these methods to prevent pregnancy:  A medicine that kills sperm (spermicide).  A device that blocks sperm (diaphragm). ? You have low levels of a female hormone (estrogen). ? You are pregnant.  You have genes that add to your risk.  You are sexually active.  You take antibiotic medicines.  You have trouble peeing because of: ? A prostate that is bigger than normal, if you are female. ? A blockage in the part of your body that drains pee from the bladder (urethra). ? A kidney stone. ? A nerve condition that affects your bladder (neurogenic bladder). ? Not getting enough to drink. ? Not peeing often enough.  You have other conditions, such as: ? Diabetes. ? A weak disease-fighting system (immune system). ? Sickle cell disease. ? Gout. ? Injury of the spine. What are the signs or symptoms? Symptoms of this condition include:  Needing to pee right away (urgently).  Peeing often.  Peeing small amounts often.  Pain or burning when peeing.  Blood in the pee.  Pee that smells bad or not like normal.  Trouble peeing.  Pee that is cloudy.  Fluid coming from the vagina, if you are female.  Pain in the belly or lower back. Other symptoms include:  Throwing up (vomiting).  No urge to eat.  Feeling mixed up (confused).  Being tired  and grouchy (irritable).  A fever.  Watery poop (diarrhea). How is this treated? This condition may be treated with:  Antibiotic medicine.  Other medicines.  Drinking enough water. Follow these instructions at home:  Medicines  Take over-the-counter and prescription medicines only as told by your doctor.  If you were prescribed an antibiotic medicine, take it as told by your doctor. Do not stop taking it even if you start to feel better. General instructions  Make sure you: ? Pee until your bladder is empty. ? Do not hold pee for a long time. ? Empty your bladder after sex. ? Wipe from front to back after pooping if you are a female. Use each tissue one time when you wipe.  Drink enough fluid to keep your pee pale yellow.  Keep all follow-up visits as told by your doctor. This is important. Contact a doctor if:  You do not get better after 1-2 days.  Your symptoms go away and then come back. Get help right away if:  You have very bad back pain.  You have very bad pain in your lower belly.  You have a fever.  You are sick to your stomach (nauseous).  You are throwing up. Summary  A urinary tract infection (UTI) is an infection of any part of the urinary tract.  This condition is caused by germs in your genital area.  There are many risk factors for a UTI. These include having a small, thin   tube to drain pee and not being able to control when you pee or poop.  Treatment includes antibiotic medicines for germs.  Drink enough fluid to keep your pee pale yellow. This information is not intended to replace advice given to you by your health care provider. Make sure you discuss any questions you have with your health care provider. Document Revised: 12/08/2017 Document Reviewed: 06/30/2017 Elsevier Patient Education  2020 Elsevier Inc.  

## 2019-11-25 LAB — URINE CULTURE

## 2020-02-25 ENCOUNTER — Other Ambulatory Visit: Payer: Self-pay

## 2020-02-25 ENCOUNTER — Ambulatory Visit: Payer: Medicaid Other | Admitting: Adult Health

## 2020-02-25 ENCOUNTER — Encounter: Payer: Self-pay | Admitting: Adult Health

## 2020-02-25 VITALS — BP 103/71 | HR 90 | Ht 64.0 in | Wt 174.5 lb

## 2020-02-25 DIAGNOSIS — Z30011 Encounter for initial prescription of contraceptive pills: Secondary | ICD-10-CM | POA: Insufficient documentation

## 2020-02-25 DIAGNOSIS — Z3202 Encounter for pregnancy test, result negative: Secondary | ICD-10-CM

## 2020-02-25 LAB — POCT URINE PREGNANCY: Preg Test, Ur: NEGATIVE

## 2020-02-25 MED ORDER — NORETHIN ACE-ETH ESTRAD-FE 1-20 MG-MCG PO TABS: 1.0000 | ORAL_TABLET | Freq: Every day | ORAL | 11 refills | Status: DC

## 2020-02-25 NOTE — Progress Notes (Signed)
  Subjective:     Patient ID: Deanna Sheppard, female   DOB: 1990/08/03, 30 y.o.   MRN: [id number]  HPI Deanna Sheppard is a 30 year old black female, single, G1P1 in to discuss starting OCs, gained weight on depo. PCP is [facility]   Review of Systems Gained weight, when on depo Patient denies any headaches, hearing loss, fatigue, blurred vision, shortness of breath, chest pain, abdominal pain, problems with bowel movements, urination, or intercourse. No joint pain or mood swings. Reviewed past medical,surgical, social and family history. Reviewed medications and allergies.      Objective:   Physical Exam BP 103/71 (BP Location: Left Arm, Patient Position: Sitting, Cuff Size: Normal)   Pulse 90   Ht 5\' 4"  (1.626 m)   Wt 174 lb 8 oz (79.2 kg)   LMP 02/01/2020   BMI 29.95 kg/m UPT negative. Skin warm and dry. Neck: mid line trachea, normal thyroid, good ROM, no lymphadenopathy noted. Lungs: clear to ausculation bilaterally. Cardiovascular: regular rate and rhythm. Fall is low  Upstream - 02/25/20 0917      Pregnancy Intention Screening   Does the patient want to become pregnant in the next year? No    Does the patient's partner want to become pregnant in the next year? No    Would the patient like to discuss contraceptive options today? Yes      Contraception Wrap Up   Current Method Abstinence    End Method Oral Contraceptive    Contraception Counseling Provided Yes             Assessment:     1. Pregnancy examination or test, negative result   2. Encounter for initial prescription of contraceptive pills Will start junel 1/20 today and use condoms for at least 1 pack of pills Meds ordered this encounter  Medications  . norethindrone-ethinyl estradiol (LOESTRIN FE) 1-20 MG-MCG tablet    Sig: Take 1 tablet by mouth daily.    Dispense:  28 tablet    Refill:  11    Order Specific Question:   Supervising Provider    Answer:   Deanna Sheppard [[id number]]      Plan:      Return in 6 weeks for pap and physical and ROS

## 2020-04-09 ENCOUNTER — Ambulatory Visit (INDEPENDENT_AMBULATORY_CARE_PROVIDER_SITE_OTHER): Payer: Medicaid Other | Admitting: Adult Health

## 2020-04-09 ENCOUNTER — Encounter: Payer: Self-pay | Admitting: Adult Health

## 2020-04-09 ENCOUNTER — Other Ambulatory Visit (HOSPITAL_COMMUNITY)
Admission: RE | Admit: 2020-04-09 | Discharge: 2020-04-09 | Disposition: A | Discharge status: Home / Self Care | Payer: Medicaid Other | Source: Ambulatory Visit | Attending: Adult Health | Admitting: Adult Health

## 2020-04-09 ENCOUNTER — Other Ambulatory Visit: Payer: Self-pay

## 2020-04-09 VITALS — BP 112/72 | HR 64 | Ht 64.0 in | Wt 174.0 lb

## 2020-04-09 DIAGNOSIS — Z01419 Encounter for gynecological examination (general) (routine) without abnormal findings: Secondary | ICD-10-CM | POA: Diagnosis not present

## 2020-04-09 DIAGNOSIS — Z3041 Encounter for surveillance of contraceptive pills: Secondary | ICD-10-CM | POA: Insufficient documentation

## 2020-04-09 NOTE — Progress Notes (Signed)
Patient ID: Deanna Sheppard, female   DOB: [birth year], 30 y.o.   MRN: [id number] History of Present Illness: Deanna Sheppard is a 30 year old black female, single, G1P1 in for a well woman gyn exam and pap. PCP [facility]   Current Medications, Allergies, Past Medical History, Past Surgical History, Family History and Social History were reviewed in [employer] record.     Review of Systems: Patient denies any headaches, hearing loss, fatigue, blurred vision, shortness of breath, chest pain, abdominal pain, problems with bowel movements, urination, or intercourse.(not currently active) No joint pain or mood swings. Had 2 periods this month, on OCs   Physical Exam:BP 112/72 (BP Location: Right Arm, Patient Position: Sitting, Cuff Size: Normal)   Pulse 64   Ht 5\' 4"  (1.626 m)   Wt 174 lb (78.9 kg)   LMP 03/23/2020 (Approximate)   BMI 29.87 kg/m  General:  Well developed, well nourished, no acute distress Skin:  Warm and dry Neck:  Midline trachea, normal thyroid, good ROM, no lymphadenopathy Lungs; Clear to auscultation bilaterally Breast:  No dominant palpable mass, retraction, or nipple discharge Cardiovascular: Regular rate and rhythm Abdomen:  Soft, non tender, no hepatosplenomegaly Pelvic:  External genitalia is normal in appearance, no lesions.  The vagina is normal in appearance. Urethra has no lesions or masses. The cervix is bulbous. Pap with GC/CHL and HR HPV genotyping performed. Uterus is felt to be normal size, shape, and contour.  No adnexal masses or tenderness noted.Bladder is non tender, no masses felt. Extremities/musculoskeletal:  No swelling or varicosities noted, no clubbing or cyanosis Psych:  No mood changes, alert and cooperative,seems happy AA is 2 Fall risk is low PHQ 9 score is 4 GAD 7 score is 2  Upstream - 04/09/20 1134      Pregnancy Intention Screening   Does the patient want to become pregnant in the next year? No    Does the patient's  partner want to become pregnant in the next year? No    Would the patient like to discuss contraceptive options today? No      Contraception Wrap Up   Current Method Oral Contraceptive    End Method Oral Contraceptive    Contraception Counseling Provided No         Examination chaperoned by Estill Bamberg LPN  Impression and plan: 1. Encounter for gynecological examination with Papanicolaou smear of cervix Pap sent Physical in 1 year Pap in 3 if normal She declines labs today  2. Encounter for surveillance of contraceptive pills Continue Loestrin 1-20, has refills   If continues with 2 periods a month may change OCs, but can take 3-4 months to get regular

## 2020-04-16 ENCOUNTER — Telehealth: Payer: Self-pay | Admitting: Adult Health

## 2020-04-16 ENCOUNTER — Encounter: Payer: Self-pay | Admitting: Adult Health

## 2020-04-16 ENCOUNTER — Telehealth: Payer: Self-pay

## 2020-04-16 DIAGNOSIS — R87620 Atypical squamous cells of undetermined significance on cytologic smear of vagina (ASC-US): Secondary | ICD-10-CM

## 2020-04-16 DIAGNOSIS — R87811 Vaginal high risk human papillomavirus (HPV) DNA test positive: Secondary | ICD-10-CM

## 2020-04-16 HISTORY — DX: Atypical squamous cells of undetermined significance on cytologic smear of vagina (ASC-US): R87.811

## 2020-04-16 HISTORY — DX: Atypical squamous cells of undetermined significance on cytologic smear of vagina (ASC-US): R87.620

## 2020-04-16 LAB — CYTOLOGY - PAP
Chlamydia: NEGATIVE
Comment: NEGATIVE
Comment: NEGATIVE
Comment: NEGATIVE
Comment: NORMAL
Diagnosis: UNDETERMINED — AB
HPV 16: NEGATIVE
HPV 18 / 45: NEGATIVE
High risk HPV: POSITIVE — AB
Neisseria Gonorrhea: NEGATIVE

## 2020-04-16 NOTE — Telephone Encounter (Signed)
Pt returning call to jennifer about the message she received on needing a Colpo and has questions and would like for someone to call her back

## 2020-04-16 NOTE — Telephone Encounter (Signed)
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

## 2020-04-16 NOTE — Telephone Encounter (Signed)
Spoke with pt regarding +HPV and colpo. HPV pamphlet mailed to pt. [place]

## 2020-04-20 ENCOUNTER — Other Ambulatory Visit: Payer: Self-pay

## 2020-04-20 ENCOUNTER — Emergency Department (HOSPITAL_COMMUNITY): Payer: Medicaid Other

## 2020-04-20 ENCOUNTER — Encounter (HOSPITAL_COMMUNITY): Payer: Self-pay | Admitting: Emergency Medicine

## 2020-04-20 ENCOUNTER — Emergency Department (HOSPITAL_COMMUNITY)
Admission: EM | Admit: 2020-04-20 | Discharge: 2020-04-20 | Disposition: A | Discharge status: Home / Self Care | Payer: Medicaid Other | Attending: Emergency Medicine | Admitting: Emergency Medicine

## 2020-04-20 DIAGNOSIS — R0789 Other chest pain: Secondary | ICD-10-CM | POA: Diagnosis present

## 2020-04-20 DIAGNOSIS — R002 Palpitations: Secondary | ICD-10-CM | POA: Insufficient documentation

## 2020-04-20 DIAGNOSIS — R079 Chest pain, unspecified: Secondary | ICD-10-CM

## 2020-04-20 DIAGNOSIS — J90 Pleural effusion, not elsewhere classified: Secondary | ICD-10-CM | POA: Insufficient documentation

## 2020-04-20 DIAGNOSIS — F17290 Nicotine dependence, other tobacco product, uncomplicated: Secondary | ICD-10-CM | POA: Diagnosis not present

## 2020-04-20 LAB — BASIC METABOLIC PANEL
Anion gap: 8 (ref 5–15)
BUN: 6 mg/dL (ref 6–20)
CO2: 26 mmol/L (ref 22–32)
Calcium: 9.5 mg/dL (ref 8.9–10.3)
Chloride: 102 mmol/L (ref 98–111)
Creatinine, Ser: 0.69 mg/dL (ref 0.44–1.00)
GFR, Estimated: >60 mL/min (ref >60)
Glucose, Bld: 102 mg/dL — ABNORMAL HIGH (ref 70–99)
Potassium: 3.9 mmol/L (ref 3.5–5.1)
Sodium: 136 mmol/L (ref 135–145)

## 2020-04-20 LAB — CBC WITH DIFFERENTIAL/PLATELET
Abs Immature Granulocytes: 0.02 10*3/uL (ref 0.00–0.07)
Basophils Absolute: 0.1 10*3/uL (ref 0.0–0.1)
Basophils Relative: 1 %
Eosinophils Absolute: 0 10*3/uL (ref 0.0–0.5)
Eosinophils Relative: 0 %
HCT: 39.2 % (ref 36.0–46.0)
Hemoglobin: 13.7 g/dL (ref 12.0–15.0)
Immature Granulocytes: 0 %
Lymphocytes Relative: 23 %
Lymphs Abs: 2.5 10*3/uL (ref 0.7–4.0)
MCH: 29.8 pg (ref 26.0–34.0)
MCHC: 34.9 g/dL (ref 30.0–36.0)
MCV: 85.2 fL (ref 80.0–100.0)
Monocytes Absolute: 0.7 10*3/uL (ref 0.1–1.0)
Monocytes Relative: 7 %
Neutro Abs: 7.5 10*3/uL (ref 1.7–7.7)
Neutrophils Relative %: 69 %
Platelets: 374 10*3/uL (ref 150–400)
RBC: 4.6 MIL/uL (ref 3.87–5.11)
RDW: 12.3 % (ref 11.5–15.5)
WBC: 10.9 10*3/uL — ABNORMAL HIGH (ref 4.0–10.5)
nRBC: 0 % (ref 0.0–0.2)

## 2020-04-20 LAB — D-DIMER, QUANTITATIVE: D-Dimer, Quant: 0.9 ug/mL-FEU — ABNORMAL HIGH (ref 0.00–0.50)

## 2020-04-20 LAB — I-STAT BETA HCG BLOOD, ED (MC, WL, AP ONLY): I-stat hCG, quantitative: <5 m[IU]/mL (ref <5)

## 2020-04-20 LAB — TROPONIN I (HIGH SENSITIVITY): Troponin I (High Sensitivity): <2 ng/L (ref <18)

## 2020-04-20 MED ORDER — IBUPROFEN 400 MG PO TABS
600.0000 mg | ORAL_TABLET | Freq: Once | ORAL | Status: AC
  Administered 2020-04-20: 600 mg via ORAL
  Filled 2020-04-20: qty 2

## 2020-04-20 MED ORDER — IOHEXOL 350 MG/ML SOLN
100.0000 mL | Freq: Once | INTRAVENOUS | Status: AC | PRN
  Administered 2020-04-20: 85 mL via INTRAVENOUS

## 2020-04-20 NOTE — Discharge Instructions (Signed)
Please follow up with your PCP regarding your ED visit today. They will need to do further evaluation regarding the fluid in your lung.   I would recommend taking Ibuprofen as needed for pain at this time.   Return to the ED IMMEDIATELY for any worsening symptoms including worsening pain, worsening shortness of breath, or any other new/concerning symptoms.

## 2020-04-20 NOTE — ED Provider Notes (Signed)
[facility] EMERGENCY DEPARTMENT Provider Note   CSN: [id number] Arrival date & time: 04/20/20  4287     History Chief Complaint  Patient presents with  . Muscle Pain    Deanna Sheppard is a 30 y.o. female who presents to the ED today with complaint of gradual onset, worsening, sharp/stabbing, right sided chest pain radiating into back for the past 3 days. Pt states that she woke up with the pain a couple of days ago. Denies any trauma or heavy lifting. She has not taken anything for the pain however states it has gotten worse. Pain is exacerbated with deep inspiration as well as movement. Pt reports she feels short of breath due to the pain. She also complains of pre syncope and feeling like her heart is racing at times. No hx DVT/PE. Pt did recently start OCPs last month. No recent prolonged travel or immobilization. Denies cough/URI like symptoms in the past month to suggest COVID infection. No active malignancy. No hemoptysis.   The history is provided by the patient and medical records.       Past Medical History:  Diagnosis Date  . Abdominal pain 11/15/2014  . ASCUS with positive high risk human papillomavirus of vagina 04/16/2020   04/2020 needs colpo, immediate risk for CIN3+ is 4.4%  . 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 Chlamydia   . Contraception management 04/13/2012  . Cough 07/23/2014  . History of chlamydia 06/06/2013  . Irregular intermenstrual bleeding 11/15/2014  . No pertinent past medical history   . Pain with urination 11/15/2014  . Rib pain on left side 07/23/2014  . Sickle cell trait ([place])   . Trichimoniasis 10/10/2019   10/10/19 treated with flagyl  . Urinary frequency 10/17/2012    Patient Active Problem List   Diagnosis Date Noted  . ASCUS with positive high risk human papillomavirus of vagina 04/16/2020  . Encounter for surveillance of contraceptive pills 04/09/2020  . Encounter for gynecological examination with  Papanicolaou smear of cervix 04/09/2020  . Encounter for initial prescription of contraceptive pills 02/25/2020  . Pregnancy examination or test, negative result 02/25/2020  . Acute cystitis 11/21/2019  . Trichimoniasis 10/10/2019  . Chlamydial cervicitis and pregnancy 04/05/2017  . Encounter for insertion of mirena IUD 12/24/2015  . 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 Irregular intermenstrual bleeding 11/15/2014  . Rib pain on left side 07/23/2014  . Cough 07/23/2014  . History of chlamydia 06/06/2013  . Sickle cell trait ([place]) 04/12/2012    Past Surgical History:  Procedure Laterality Date  . NO PAST SURGERIES       OB History    Gravida  1   Para  1   Term  1   Preterm  0   AB  0   Living  1     SAB  0   IAB  0   Ectopic  0   Multiple  0   Live Births  1           Family History  Adopted: Yes  Problem Relation Age of Onset  . Hypertension Mother   . Cancer Maternal Grandmother        breast   . Diabetes Maternal Grandmother     Social History   Tobacco Use  . Smoking status: Light Tobacco Smoker    Types: Cigars  . Smokeless tobacco: Never Used  Vaping Use  . Vaping Use: Never used  Substance Use Topics  . Alcohol use: Yes  . Drug use: Never    Home Medications Prior to Admission medications   Medication Sig Start Date End Date Taking? Authorizing Provider  norethindrone-ethinyl estradiol (LOESTRIN FE) 1-20 MG-MCG tablet Take 1 tablet by mouth daily. 02/25/20 02/24/21 Yes Estill Dooms, NP    Allergies    Bee venom, Azithromycin, and Levofloxacin  Review of Systems   Review of Systems  Constitutional: Negative for chills and fever.  Respiratory: Positive for shortness of breath.   Cardiovascular: Positive for chest pain and palpitations. Negative for leg swelling.  All other systems reviewed and are negative.   Physical Exam Updated Vital Signs BP 116/80   Pulse 73   Temp 97.6 F  (36.4 C) (Oral)   Ht 5\' 4"  (1.626 m)   Wt 74.4 kg   LMP 03/23/2020 (Approximate)   SpO2 99%   BMI 28.15 kg/m   Physical Exam Vitals and nursing note reviewed.  Constitutional:      Appearance: She is not ill-appearing or diaphoretic.  HENT:     Head: Normocephalic and atraumatic.  Eyes:     Conjunctiva/sclera: Conjunctivae normal.  Cardiovascular:     Rate and Rhythm: Normal rate and regular rhythm.     Pulses: Normal pulses.  Pulmonary:     Effort: Pulmonary effort is normal.     Breath sounds: Normal breath sounds. No wheezing, rhonchi or rales.    Chest:     Chest wall: Tenderness present.       Comments: No overlying skin changes to right breast/chest wall. + severe TTP to the right lower ribs without crepitus. Similar + TTP to the right mid back along the ribs.  Abdominal:     Palpations: Abdomen is soft.     Tenderness: There is no abdominal tenderness. There is no guarding or rebound.  Musculoskeletal:     Cervical back: Neck supple.     Right lower leg: No edema.     Left lower leg: No edema.  Skin:    General: Skin is warm and dry.  Neurological:     Mental Status: She is alert.     ED Results / Procedures / Treatments   Labs (all labs ordered are listed, but only abnormal results are displayed) Labs Reviewed  BASIC METABOLIC PANEL - Abnormal; Notable for the following components:      Result Value   Glucose, Bld 102 (*)    All other components within normal limits  CBC WITH DIFFERENTIAL/PLATELET - Abnormal; Notable for the following components:   WBC 10.9 (*)    All other components within normal limits  D-DIMER, QUANTITATIVE - Abnormal; Notable for the following components:   D-Dimer, Quant 0.90 (*)    All other components within normal limits  I-STAT BETA HCG BLOOD, ED ([facility], [facility], [facility] ONLY)  TROPONIN I (HIGH SENSITIVITY)    EKG EKG Interpretation  Date/Time:  Sunday April 20 2020 09:58:53 EDT Ventricular Rate:  66 PR Interval:  172 QRS  Duration: 106 QT Interval:  405 QTC Calculation: 425 R Axis:   43 Text Interpretation: Sinus rhythm RSR' in V1 or V2, right VCD or RVH No acute changes No significant change since last tracing Confirmed by Varney Biles [phone]) on 04/20/2020 10:33:26 AM   Radiology DG Chest 2 View  Result Date: 04/20/2020 CLINICAL DATA:  Chest pain EXAM: CHEST - 2 VIEW COMPARISON:  Chest x-ray dated 07/16/2017. FINDINGS: Heart size and mediastinal contours are within normal  limits. Lungs are clear. No pleural effusion or pneumothorax is seen. Osseous structures about the chest are unremarkable. IMPRESSION: No active cardiopulmonary disease. No evidence of pneumonia or pulmonary edema. Electronically Signed   By: Franki Cabot M.D.   On: 04/20/2020 09:36   CT Angio Chest PE W/Cm &/Or Wo Cm  Result Date: 04/20/2020 CLINICAL DATA:  Upper RIGHT-sided chest pain radiating to RIGHT back. Elevated D-dimer. EXAM: CT ANGIOGRAPHY CHEST WITH CONTRAST TECHNIQUE: Multidetector CT imaging of the chest was performed using the standard protocol during bolus administration of intravenous contrast. Multiplanar CT image reconstructions and MIPs were obtained to evaluate the vascular anatomy. CONTRAST:  88mL OMNIPAQUE IOHEXOL 350 MG/ML SOLN COMPARISON:  None. FINDINGS: Cardiovascular: Some of the most peripheral segmental and subsegmental pulmonary artery branches within each lung are difficult to definitively characterize due to patient breathing motion artifact, however, there is no pulmonary embolism identified within the main, lobar or segmental pulmonary arteries bilaterally. No significant pericardial effusion. No thoracic aortic aneurysm or evidence of aortic dissection. Mediastinum/Nodes: No mass or enlarged lymph nodes are seen within the mediastinum or perihilar regions. Esophagus is unremarkable. Trachea and central bronchi are unremarkable. Lungs/Pleura: Small RIGHT pleural effusion with adjacent atelectasis. RIGHT lung is  otherwise clear. No pneumothorax Minimal atelectasis at the LEFT lung base. LEFT lung is otherwise clear. No pneumothorax. Upper Abdomen: Limited images of the upper abdomen are unremarkable. Musculoskeletal: No chest wall abnormality. No acute or significant osseous findings. Review of the MIP images confirms the above findings. IMPRESSION: 1. Small RIGHT pleural effusion with adjacent atelectasis. 2. No pulmonary embolism seen. Electronically Signed   By: Franki Cabot M.D.   On: 04/20/2020 11:42    Procedures Procedures   Medications Ordered in ED Medications  iohexol (OMNIPAQUE) 350 MG/ML injection 100 mL (85 mLs Intravenous Contrast Given 04/20/20 1107)  ibuprofen (ADVIL) tablet 600 mg (600 mg Oral Given 04/20/20 1214)    ED Course  I have reviewed the triage vital signs and the nursing notes.  Pertinent labs & imaging results that were available during my care of the patient were reviewed by me and considered in my medical decision making (see chart for details).  Clinical Course as of 04/20/20 1250  Sun Apr 20, 2020  1022 D-Dimer, Quant(!): 0.90 [MV]    Clinical Course User Index [MV] Eustaquio Maize, [place]   MDM Rules/Calculators/A&P                          30 year old female presents to the ED today complaining of atraumatic right-sided chest wall pain radiating into her right back.  Worse with deep inspiration.  Recently started contraception 1 month ago.  No other risk factors for PE at this time.  On arrival to the ED patient is afebrile, nontachycardic, nontachypneic.  She is satting 99% on room air and able to speak in full sentences without difficulty.  She does complain of some heart palpitations as well.  She is not taking anything for pain.  She does report she had 2 menstrual cycles last month she attributes to starting her birth control.  On exam she has exquisite tenderness palpation to the right lower ribs anteriorly and posteriorly.  Again she denies any trauma to this  area.  There is mild concern for PE at this time given her constellation of symptoms however vitals are less suspicious today.  We will plan for lab work at this time including a dimer as well as  chest x-ray and EKG.  Will await pregnancy test prior to providing pain medication at this time.  If work-up is negative will treat with musculoskeletal type pain.  CBC with mild leukocytosis 10.9 however appears improved from previous. Low suspicion for infection today.   WBC  Date Value Ref Range Status  04/20/2020 10.9 (H) 4.0 - 10.5 K/uL Final  10/20/2019 12.3 (H) 4.0 - 10.5 K/uL Final  09/10/2018 11.1 (H) 4.0 - 10.5 K/uL Final  06/06/2018 10.1 4.0 - 10.5 K/uL Final   BMP without electrolyte abnormalities Troponin < 2 D dimer elevated at 0.90. Will need CTA at this time to rule out PE.  Beta hcg negative CXR clear  CTA: IMPRESSION:  1. Small RIGHT pleural effusion with adjacent atelectasis.  2. No pulmonary embolism seen.   Pt provided with Ibuprofen for pain with good improvement. She continues to have normal O2 saturations 98% + without any increased work of breathing. Will discharge home at this time and have pt follow up with PCP for further evaluation of pleural effusion. She is instructed to take Ibuprofen PRN for pain and to return to the ED for any worsening symptoms specifically worsening SOB. Pt is in agreement with plan and stable for discharge home.   This note was prepared using Dragon voice recognition software and may include unintentional dictation errors due to the inherent limitations of voice recognition software.  Final Clinical Impression(s) / ED Diagnoses Final diagnoses:  Right-sided chest pain  Pleural effusion on right    Rx / DC Orders ED Discharge Orders    None       Discharge Instructions     Please follow up with your PCP regarding your ED visit today. They will need to do further evaluation regarding the fluid in your lung.   I would recommend  taking Ibuprofen as needed for pain at this time.   Return to the ED IMMEDIATELY for any worsening symptoms including worsening pain, worsening shortness of breath, or any other new/concerning symptoms.        Eustaquio Maize, PA-C 04/20/20 Hasty, Ankit, MD 04/20/20 1423

## 2020-04-20 NOTE — ED Triage Notes (Signed)
Pt c/o of upper right sided pain that radiates from her right back side to her right breast. States it hurts worse when she moves her right arm.

## 2020-05-05 ENCOUNTER — Encounter: Payer: Medicaid Other | Admitting: Women's Health

## 2020-06-13 ENCOUNTER — Other Ambulatory Visit: Payer: Self-pay

## 2020-06-13 ENCOUNTER — Encounter: Payer: Self-pay | Admitting: *Deleted

## 2020-06-13 ENCOUNTER — Other Ambulatory Visit (HOSPITAL_COMMUNITY)
Admission: RE | Admit: 2020-06-13 | Discharge: 2020-06-13 | Disposition: A | Discharge status: Home / Self Care | Payer: Medicaid Other | Source: Ambulatory Visit | Attending: Obstetrics & Gynecology | Admitting: Obstetrics & Gynecology

## 2020-06-13 ENCOUNTER — Encounter: Payer: Self-pay | Admitting: Women's Health

## 2020-06-13 ENCOUNTER — Ambulatory Visit (INDEPENDENT_AMBULATORY_CARE_PROVIDER_SITE_OTHER): Payer: Medicaid Other | Admitting: Women's Health

## 2020-06-13 VITALS — BP 97/59 | HR 72 | Ht 64.0 in | Wt 168.4 lb

## 2020-06-13 DIAGNOSIS — R87610 Atypical squamous cells of undetermined significance on cytologic smear of cervix (ASC-US): Secondary | ICD-10-CM

## 2020-06-13 DIAGNOSIS — R87811 Vaginal high risk human papillomavirus (HPV) DNA test positive: Secondary | ICD-10-CM

## 2020-06-13 DIAGNOSIS — Z3202 Encounter for pregnancy test, result negative: Secondary | ICD-10-CM | POA: Diagnosis not present

## 2020-06-13 DIAGNOSIS — R87810 Cervical high risk human papillomavirus (HPV) DNA test positive: Secondary | ICD-10-CM | POA: Insufficient documentation

## 2020-06-13 LAB — POCT URINE PREGNANCY: Preg Test, Ur: NEGATIVE

## 2020-06-13 NOTE — Progress Notes (Signed)
   COLPOSCOPY PROCEDURE NOTE Patient name: Deanna Sheppard MRN [id number]  Date of birth: [birth year] Subjective Findings:   Deanna Sheppard is a 30 y.o. [id number] African American female being seen today for a colposcopy. Indication: Abnormal pap on 04/09/20: ASCUS w/ HRHPV positive: other (not 16, 18/45)  Prior cytology:  Date Result Procedure  2019 NILM w/ HRHPV not done None  2017 NILM w/ HRHPV not done None          Patient's last menstrual period was 06/06/2020. Contraception: OCP (estrogen/progesterone). Menopausal: no. Hysterectomy: no.   Smoker: yes. Immunocompromised: no.   The risks and benefits were explained and informed consent was obtained, and written copy is in chart. Pertinent History Reviewed:   Reviewed past medical,surgical, social, obstetrical and family history.  Reviewed problem list, medications and allergies. Objective Findings & Procedure:   Vitals:   06/13/20 1102  BP: (!) 97/59  Pulse: 72  Weight: 168 lb 6.4 oz (76.4 kg)  Height: 5\' 4"  (1.626 m)  Body mass index is 28.91 kg/m.  Results for orders placed or performed in visit on 06/13/20 (from the past 24 hour(s))  POCT urine pregnancy   Collection Time: 06/13/20 11:04 AM  Result Value Ref Range   Preg Test, Ur Negative Negative     Time out was performed.  Speculum placed in the vagina, cervix fully visualized. SCJ: not fully visualized. Cervix swabbed x 3 with acetic acid.  Acetowhitening present: Yes Cervix: no visible lesions, no mosaicism, no punctation, no abnormal vasculature, and acetowhite lesion(s) noted at 10-2 and 4 o'clock. Endocervical curettage performed and Cervical biopsies taken at 12 & 4 o'clock. Vagina: vaginal colposcopy not performed Vulva: vulvar colposcopy not performed  Specimens: 3 (2bx & ECC)  Complications: none  Chaperone: Celene Squibb    Colposcopic Impression & Plan:   Colposcopy findings consistent with LSIL Plan: Post biopsy instructions given, Will  notify patient of results when back, and Will base plan of care on pathology results and ASCCP guidelines  No follow-ups on file.  [place], [facility] 06/13/2020 11:09 AM

## 2020-06-13 NOTE — Addendum Note (Signed)
Addended by: [name] on: 06/13/2020 11:43 AM   Modules accepted: Orders

## 2020-06-13 NOTE — Patient Instructions (Signed)
[email or web address]  Colposcopy, Care After This sheet gives you information about how to care for yourself after your procedure. Your health care provider may also give you more specific instructions. If you have problems or questions, contact your health careprovider. What can I expect after the procedure? If you had a colposcopy without a biopsy, you can expect to feel fine right away after your procedure. However, you may have some spotting of blood for afew days. You can return to your normal activities. If you had a colposcopy with a biopsy, it is common after the procedure to have: Soreness and mild pain. These may last for a few days. Light-headedness. Mild vaginal bleeding or discharge that is dark-colored and grainy. This may last for a few days. The discharge may be caused by a liquid (solution) that was used during the procedure. You may need to wear a sanitary pad during this time. Spotting of blood for at least 48 hours after the procedure. Follow these instructions at home: Medicines Take over-the-counter and prescription medicines only as told by your health care provider. Talk with your health care provider about what type of over-the-counter pain medicine and prescription medicine you can start to take again. It is especially important to talk with your health care provider if you take blood thinners. Activity Limit your physical activity for the first day after your procedure as told by your health care provider. Avoid using douche products, using tampons, or having sex for at least 3 days after the procedure or for as long as told. Return to your normal activities as told by your health care provider. Ask your health care provider what activities are safe for you. General instructions  Drink enough fluid to keep your urine pale yellow. Ask your health care provider if you may take baths, swim, or use a hot tub. You may take showers. If you use  birth control (contraception), continue to use it. Keep all follow-up visits as told by your health care provider. This is important.  Contact a health care provider if: You develop a skin rash. Get help right away if: You bleed a lot from your vagina or pass blood clots. This includes using more than one sanitary pad each hour for 2 hours in a row. You have a fever or chills. You have vaginal discharge that is abnormal, is yellow in color, or smells bad. This could be a sign of infection. You have severe pain or cramps in your lower abdomen that do not go away with medicine. You faint. Summary If you had a colposcopy without a biopsy, you can expect to feel fine right away, but you may have some spotting of blood for a few days. You can return to your normal activities. If you had a colposcopy with a biopsy, it is common to have mild pain for a few days and spotting for 48 hours after the procedure. Avoid using douche products, using tampons, and having sex for at least 3 days after the procedure or for as long as told by your health care provider. Get help right away if you have heavy bleeding, severe pain, or signs of infection. This information is not intended to replace advice given to you by your health care provider. Make sure you discuss any questions you have with your healthcare provider. Document Revised: 12/20/2018 Document Reviewed: 12/20/2018 Elsevier Patient Education  2022 Elsevier Inc.  

## 2020-06-16 ENCOUNTER — Encounter: Payer: Self-pay | Admitting: Emergency Medicine

## 2020-06-16 ENCOUNTER — Ambulatory Visit
Admission: EM | Admit: 2020-06-16 | Discharge: 2020-06-16 | Disposition: A | Discharge status: Home / Self Care | Payer: Medicaid Other | Attending: Family Medicine | Admitting: Family Medicine

## 2020-06-16 DIAGNOSIS — B349 Viral infection, unspecified: Secondary | ICD-10-CM

## 2020-06-16 LAB — SURGICAL PATHOLOGY

## 2020-06-16 MED ORDER — PROMETHAZINE-DM 6.25-15 MG/5ML PO SYRP: 5.0000 mL | ORAL_SOLUTION | Freq: Four times a day (QID) | ORAL | 0 refills | Status: DC | PRN

## 2020-06-16 MED ORDER — IPRATROPIUM BROMIDE 0.03 % NA SOLN: 2.0000 | Freq: Two times a day (BID) | NASAL | 0 refills | Status: DC

## 2020-06-16 NOTE — ED Triage Notes (Signed)
Headache, sore throat, dry cough, body aches with chills since yesterday.  States ears are ringing.

## 2020-06-16 NOTE — Discharge Instructions (Signed)
Your COVID 19 /Flu results should result within 3-5 days. Negative results are immediately resulted to Mychart. Positive results will receive a follow-up call from our clinic. If symptoms are present, I recommend home quarantine until results are known.  Alternate Tylenol and ibuprofen as needed for body aches and fever.  Symptom management per recommendations discussed today.  If any breathing difficulty or chest pain develops go immediately to the closest emergency department for evaluation.

## 2020-06-16 NOTE — ED Provider Notes (Signed)
[facility]    CSN: [id number] Arrival date & time: 06/16/20  1523      History   Chief Complaint No chief complaint on file.   HPI Deanna Sheppard is a 30 y.o. female.   HPI Patient presents with URI symptoms including cough, sore throat, ears are ringing with otalgia, nasal congestion, runny nose, and headache.  Unknown of COVID exposure.  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  She is afebrile at present. Denies worrisome symptoms of shortness of breath, weakness, N&V, or  chest pain    Past Medical History:  Diagnosis Date   Abdominal pain 11/15/2014   ASCUS with positive high risk human papillomavirus of vagina 04/16/2020   04/2020 needs colpo, immediate risk for CIN3+ is 4.4%   Cervical high risk HPV (human papillomavirus) test positive 11/17/2015   Chlamydia    Contraception management 04/13/2012   Cough 07/23/2014   History of chlamydia 06/06/2013   Irregular intermenstrual bleeding 11/15/2014   No pertinent past medical history    Pain with urination 11/15/2014   Rib pain on left side 07/23/2014   Sickle cell trait ([place])    Trichimoniasis 10/10/2019   10/10/19 treated with flagyl   Urinary frequency 10/17/2012    Patient Active Problem List   Diagnosis Date Noted   Acute cystitis 11/21/2019   Trichimoniasis 10/10/2019   Encounter for insertion of mirena IUD 12/24/2015   Cervical high risk HPV (human papillomavirus) test positive 11/17/2015   Irregular intermenstrual bleeding 11/15/2014   Rib pain on left side 07/23/2014   Cough 07/23/2014   History of chlamydia 06/06/2013   Sickle cell trait ([place]) 04/12/2012    Past Surgical History:  Procedure Laterality Date   NO PAST SURGERIES      OB History     Gravida  1   Para  1   Term  1   Preterm  0   AB  0   Living  1      SAB  0   IAB  0   Ectopic  0   Multiple  0   Live Births  1            Home Medications    Prior to Admission medications   Medication Sig Start Date End  Date Taking? Authorizing Provider  norethindrone-ethinyl estradiol (LOESTRIN FE) 1-20 MG-MCG tablet Take 1 tablet by mouth daily. 02/25/20 02/24/21  Estill Dooms, NP    Family History Family History  Adopted: Yes  Problem Relation Age of Onset   Hypertension Mother    Cancer Maternal Grandmother        breast    Diabetes Maternal Grandmother     Social History Social History   Tobacco Use   Smoking status: Light Smoker    Pack years: 0.00    Types: Cigars   Smokeless tobacco: Never  Vaping Use   Vaping Use: Never used  Substance Use Topics   Alcohol use: Yes   Drug use: Never     Allergies   Bee venom, Azithromycin, and Levofloxacin   Review of Systems Review of Systems Pertinent negatives listed in HPI  Physical Exam Triage Vital Signs ED Triage Vitals  Enc Vitals Group     BP 06/16/20 1717 118/82     Pulse Rate 06/16/20 1717 63     Resp 06/16/20 1717 16     Temp 06/16/20 1717 98 F (36.7 C)     Temp Source 06/16/20 1717 Oral  SpO2 06/16/20 1717 98 %     Weight --      Height --      Head Circumference --      Peak Flow --      Pain Score 06/16/20 1718 7     Pain Loc --      Pain Edu? --      Excl. in [place]? --    No data found.  Updated Vital Signs BP 118/82 (BP Location: Right Arm)   Pulse 63   Temp 98 F (36.7 C) (Oral)   Resp 16   LMP 06/06/2020   SpO2 98%   Visual Acuity Right Eye Distance:   Left Eye Distance:   Bilateral Distance:    Right Eye Near:   Left Eye Near:    Bilateral Near:     Physical Exam  General Appearance:    Alert, cooperative, no distress  HENT:   Normocephalic, ears normal, nares mucosal edema with congestion, rhinorrhea, oropharynx    Eyes:    PERRL, conjunctiva/corneas clear, EOM's intact       Lungs:     Clear to auscultation bilaterally, respirations unlabored  Heart:    Regular rate and rhythm  Neurologic:   Awake, alert, oriented x 3. No apparent focal neurological           defect.      UC  Treatments / Results  Labs (all labs ordered are listed, but only abnormal results are displayed) Labs Reviewed  COVID-19, FLU A+B NAA    EKG   Radiology No results found.  Procedures Procedures (including critical care time)  Medications Ordered in UC Medications - No data to display  Initial Impression / Assessment and Plan / UC Course  I have reviewed the triage vital signs and the nursing notes.  Pertinent labs & imaging results that were available during my care of the patient were reviewed by me and considered in my medical decision making (see chart for details).    Viral illness, symptomatic management warranted only.  COVID/flu test pending.  Continue home management per instructions.  Symptomatic management per discharge medication orders.  Return precautions given.  Work note provided.     Final Clinical Impressions(s) / UC Diagnoses   Final diagnoses:  Viral illness     Discharge Instructions      Your COVID 19 /Flu results should result within 3-5 days. Negative results are immediately resulted to Mychart. Positive results will receive a follow-up call from our clinic. If symptoms are present, I recommend home quarantine until results are known.  Alternate Tylenol and ibuprofen as needed for body aches and fever.  Symptom management per recommendations discussed today.  If any breathing difficulty or chest pain develops go immediately to the closest emergency department for evaluation.     ED Prescriptions     Medication Sig Dispense Auth. Provider   ipratropium (ATROVENT) 0.03 % nasal spray Place 2 sprays into both nostrils 2 (two) times daily. 30 mL Scot Jun, FNP   promethazine-dextromethorphan (PROMETHAZINE-DM) 6.25-15 MG/5ML syrup Take 5 mLs by mouth 4 (four) times daily as needed for cough. 140 mL Scot Jun, FNP      PDMP not reviewed this encounter.   Scot Jun, [place] 06/21/20 [phone]

## 2020-06-18 LAB — COVID-19, FLU A+B NAA
Influenza A, NAA: NOT DETECTED
Influenza B, NAA: NOT DETECTED
SARS-CoV-2, NAA: DETECTED — AB

## 2020-07-10 ENCOUNTER — Emergency Department (HOSPITAL_COMMUNITY)
Admission: EM | Admit: 2020-07-10 | Discharge: 2020-07-10 | Disposition: A | Discharge status: Home / Self Care | Payer: Medicaid Other | Attending: Emergency Medicine | Admitting: Emergency Medicine

## 2020-07-10 ENCOUNTER — Other Ambulatory Visit: Payer: Self-pay

## 2020-07-10 DIAGNOSIS — M71331 Other bursal cyst, right wrist: Secondary | ICD-10-CM | POA: Insufficient documentation

## 2020-07-10 DIAGNOSIS — M25531 Pain in right wrist: Secondary | ICD-10-CM | POA: Diagnosis present

## 2020-07-10 DIAGNOSIS — F17290 Nicotine dependence, other tobacco product, uncomplicated: Secondary | ICD-10-CM | POA: Diagnosis not present

## 2020-07-10 NOTE — ED Triage Notes (Signed)
Patient states right wrist pain for one week and packages boxes for work. Radiation to pinky and thumb. Patient moving wrist in circular motion in triage without facial grimacing.

## 2020-07-10 NOTE — ED Provider Notes (Signed)
[facility] EMERGENCY DEPARTMENT Provider Note  CSN: [id number] Arrival date & time: 07/10/20 7902    History Chief Complaint  Patient presents with   Wrist Pain    Deanna Sheppard is a 30 y.o. female reports R wrist pain for about a week, initially on the thumb side but now also on the pinky side. She has noticed a knot on her dorsal hand. No falls or injuries.    Past Medical History:  Diagnosis Date   Abdominal pain 11/15/2014   ASCUS with positive high risk human papillomavirus of vagina 04/16/2020   04/2020 needs colpo, immediate risk for CIN3+ is 4.4%   Cervical high risk HPV (human papillomavirus) test positive 11/17/2015   Chlamydia    Contraception management 04/13/2012   Cough 07/23/2014   History of chlamydia 06/06/2013   Irregular intermenstrual bleeding 11/15/2014   No pertinent past medical history    Pain with urination 11/15/2014   Rib pain on left side 07/23/2014   Sickle cell trait ([place])    Trichimoniasis 10/10/2019   10/10/19 treated with flagyl   Urinary frequency 10/17/2012    Past Surgical History:  Procedure Laterality Date   NO PAST SURGERIES      Family History  Adopted: Yes  Problem Relation Age of Onset   Hypertension Mother    Cancer Maternal Grandmother        breast    Diabetes Maternal Grandmother     Social History   Tobacco Use   Smoking status: Light Smoker    Pack years: 0.00    Types: Cigars   Smokeless tobacco: Never  Vaping Use   Vaping Use: Never used  Substance Use Topics   Alcohol use: Yes   Drug use: Never     Home Medications Prior to Admission medications   Medication Sig Start Date End Date Taking? Authorizing Provider  ipratropium (ATROVENT) 0.03 % nasal spray Place 2 sprays into both nostrils 2 (two) times daily. 06/16/20   Scot Jun, FNP  norethindrone-ethinyl estradiol (LOESTRIN FE) 1-20 MG-MCG tablet Take 1 tablet by mouth daily. 02/25/20 02/24/21  Estill Dooms, NP   promethazine-dextromethorphan (PROMETHAZINE-DM) 6.25-15 MG/5ML syrup Take 5 mLs by mouth 4 (four) times daily as needed for cough. 06/16/20   Scot Jun, FNP     Allergies    Bee venom, Azithromycin, and Levofloxacin   Review of Systems   Review of Systems  Musculoskeletal:  Positive for arthralgias and joint swelling.    Physical Exam BP 140/87   Pulse 98   Temp 98.7 F (37.1 C) (Oral)   Resp 20   Ht 5\' 4"  (1.626 m)   Wt 74.8 kg   SpO2 100%   BMI 28.32 kg/m   Physical Exam Vitals and nursing note reviewed.  HENT:     Head: Normocephalic.     Nose: Nose normal.  Eyes:     Extraocular Movements: Extraocular movements intact.  Pulmonary:     Effort: Pulmonary effort is normal.  Musculoskeletal:        General: Normal range of motion.     Cervical back: Neck supple.     Comments: Normal ROM, no bony tenderness, no erythema or induration. There is a 1cm synovial cyst on dorsal R hand  Skin:    Findings: No rash (on exposed skin).  Neurological:     Mental Status: She is alert and oriented to person, place, and time.  Psychiatric:  Mood and Affect: Mood normal.     ED Results / Procedures / Treatments   Labs (all labs ordered are listed, but only abnormal results are displayed) Labs Reviewed - No data to display  EKG None  Radiology No results found.  Procedures Procedures  Medications Ordered in the ED Medications - No data to display   MDM Rules/Calculators/A&P MDM Patient with atraumatic wrist pain, likely from synovial cyst. Recommend wrist brace, NSAIDs and ortho follow up for definitive care.   ED Course  I have reviewed the triage vital signs and the nursing notes.  Pertinent labs & imaging results that were available during my care of the patient were reviewed by me and considered in my medical decision making (see chart for details).     Final Clinical Impression(s) / ED Diagnoses Final diagnoses:  Synovial cyst of right  wrist    Rx / DC Orders ED Discharge Orders     None        Truddie Hidden, MD 07/10/20 [phone]

## 2020-07-10 NOTE — ED Notes (Signed)
Wrist splint applied to right wrist at this time.

## 2020-07-10 NOTE — Discharge Instructions (Addendum)
Please wear your wrist brace as needed for comfort. Take motrin or tylenol for pain. Follow up with Ortho for further evaluation.

## 2020-07-22 ENCOUNTER — Ambulatory Visit: Payer: Medicaid Other | Admitting: Orthopaedic Surgery

## 2020-07-22 ENCOUNTER — Encounter: Payer: Self-pay | Admitting: Orthopaedic Surgery

## 2020-07-22 ENCOUNTER — Other Ambulatory Visit: Payer: Self-pay

## 2020-07-22 VITALS — BP 118/78 | HR 83 | Ht 64.0 in | Wt 169.8 lb

## 2020-07-22 DIAGNOSIS — M67431 Ganglion, right wrist: Secondary | ICD-10-CM | POA: Diagnosis not present

## 2020-07-22 NOTE — Patient Instructions (Addendum)
Return to see Dr. Amedeo Kinsman

## 2020-07-22 NOTE — Progress Notes (Signed)
Subjective:    Patient ID: Deanna Sheppard, female    DOB: May 01, 1990, 30 y.o.   MRN: [id number]  HPI She has a ganglion cyst of the right dorsal wrist area.  She has been seen in [place] by orthopedist there on [phone], 01-14-20 and again 06-06-20.  I have reviewed the notes. She has had two aspirations and the cyst has returned.  She went to the ER at [facility] on 07-10-20 for the same problem and referred here.  She has pain from the cyst with some numbness of the dorsum of the right thumb at times.  She has no redness.  She has no trauma.  She is tried of dealing with it and would like to consider surgery.  I have explained what a ganglion cyst is.  She appears to understand.   Review of Systems  Constitutional:  Positive for activity change.  Musculoskeletal:  Positive for arthralgias.  All other systems reviewed and are negative. For Review of Systems, all other systems reviewed and are negative.  The following is a summary of the past history medically, past history surgically, known current medicines, social history and family history.  This information is gathered electronically by the computer from prior information and documentation.  I review this each visit and have found including this information at this point in the chart is beneficial and informative.   Past Medical History:  Diagnosis Date   Abdominal pain 11/15/2014   ASCUS with positive high risk human papillomavirus of vagina 04/16/2020   04/2020 needs colpo, immediate risk for CIN3+ is 4.4%   Cervical high risk HPV (human papillomavirus) test positive 11/17/2015   Chlamydia    Contraception management 04/13/2012   Cough 07/23/2014   History of chlamydia 06/06/2013   Irregular intermenstrual bleeding 11/15/2014   No pertinent past medical history    Pain with urination 11/15/2014   Rib pain on left side 07/23/2014   Sickle cell trait ([place])    Trichimoniasis 10/10/2019   10/10/19 treated with flagyl   Urinary  frequency 10/17/2012    Past Surgical History:  Procedure Laterality Date   NO PAST SURGERIES      Current Outpatient Medications on File Prior to Visit  Medication Sig Dispense Refill   ipratropium (ATROVENT) 0.03 % nasal spray Place 2 sprays into both nostrils 2 (two) times daily. 30 mL 0   norethindrone-ethinyl estradiol (LOESTRIN FE) 1-20 MG-MCG tablet Take 1 tablet by mouth daily. 28 tablet 11   promethazine-dextromethorphan (PROMETHAZINE-DM) 6.25-15 MG/5ML syrup Take 5 mLs by mouth 4 (four) times daily as needed for cough. 140 mL 0   No current facility-administered medications on file prior to visit.    Social History   Socioeconomic History   Marital status: Single    Spouse name: Not on file   Number of children: Not on file   Years of education: Not on file   Highest education level: Not on file  Occupational History   Not on file  Tobacco Use   Smoking status: Light Smoker    Types: Cigars   Smokeless tobacco: Never  Vaping Use   Vaping Use: Never used  Substance and Sexual Activity   Alcohol use: Yes   Drug use: Never   Sexual activity: Not Currently    Birth control/protection: Pill  Other Topics Concern   Not on file  Social History Narrative   Not on file   Social Determinants of Health   Financial Resource Strain: Medium Risk  Difficulty of Paying Living Expenses: Somewhat hard  Food Insecurity: No Food Insecurity   Worried About Running Out of Food in the Last Year: Never true   Ran Out of Food in the Last Year: Never true  Transportation Needs: No Transportation Needs   Lack of Transportation (Medical): No   Lack of Transportation (Non-Medical): No  Physical Activity: Insufficiently Active   Days of Exercise per Week: 3 days   Minutes of Exercise per Session: 30 min  Stress: No Stress Concern Present   Feeling of Stress : Only a little  Social Connections: Socially Isolated   Frequency of Communication with Friends and Family: Three times  a week   Frequency of Social Gatherings with Friends and Family: Once a week   Attends Religious Services: Never   Marine scientist or Organizations: No   Attends Music therapist: Never   Marital Status: Never married  Human resources officer Violence: Not At Risk   Fear of Current or Ex-Partner: No   Emotionally Abused: No   Physically Abused: No   Sexually Abused: No    Family History  Adopted: Yes  Problem Relation Age of Onset   Hypertension Mother    Cancer Maternal Grandmother        breast    Diabetes Maternal Grandmother     BP 118/78   Pulse 83   Ht 5\' 4"  (1.626 m)   Wt 169 lb 12.8 oz (77 kg)   BMI 29.15 kg/m   Body mass index is 29.15 kg/m.     Objective:   Physical Exam Vitals and nursing note reviewed. Exam conducted with a chaperone present.  Constitutional:      Appearance: She is well-developed.  HENT:     Head: Normocephalic and atraumatic.  Eyes:     Conjunctiva/sclera: Conjunctivae normal.     Pupils: Pupils are equal, round, and reactive to light.  Cardiovascular:     Rate and Rhythm: Normal rate and regular rhythm.  Pulmonary:     Effort: Pulmonary effort is normal.  Abdominal:     Palpations: Abdomen is soft.  Musculoskeletal:       Hands:     Cervical back: Normal range of motion and neck supple.  Skin:    General: Skin is warm and dry.  Neurological:     Mental Status: She is alert and oriented to person, place, and time.     Cranial Nerves: No cranial nerve deficit.     Motor: No abnormal muscle tone.     Coordination: Coordination normal.     Deep Tendon Reflexes: Reflexes are normal and symmetric. Reflexes normal.  Psychiatric:        Behavior: Behavior normal.        Thought Content: Thought content normal.        Judgment: Judgment normal.          Assessment & Plan:   Encounter Diagnosis  Name Primary?   Ganglion cyst of dorsum of right wrist Yes   I have explained the nature of the ganglion cyst.   After two aspirations and recurrence, I have recommended consideration of surgery.  It could still recur but less likely,  I will have her see Dr. Amedeo Kinsman for possible outpatient surgery.  She understands and agrees.  Call if any problem.  Precautions discussed.  Electronically Signed Sanjuana Kava, MD 7/19/20229:02 AM

## 2020-07-27 ENCOUNTER — Encounter (HOSPITAL_COMMUNITY): Payer: Self-pay | Admitting: *Deleted

## 2020-07-27 ENCOUNTER — Emergency Department (HOSPITAL_COMMUNITY)
Admission: EM | Admit: 2020-07-27 | Discharge: 2020-07-27 | Disposition: A | Discharge status: Home / Self Care | Payer: Medicaid Other | Attending: Emergency Medicine | Admitting: Emergency Medicine

## 2020-07-27 ENCOUNTER — Other Ambulatory Visit: Payer: Self-pay

## 2020-07-27 DIAGNOSIS — J020 Streptococcal pharyngitis: Secondary | ICD-10-CM | POA: Diagnosis not present

## 2020-07-27 DIAGNOSIS — Z20822 Contact with and (suspected) exposure to covid-19: Secondary | ICD-10-CM | POA: Diagnosis not present

## 2020-07-27 DIAGNOSIS — R519 Headache, unspecified: Secondary | ICD-10-CM | POA: Diagnosis not present

## 2020-07-27 DIAGNOSIS — J029 Acute pharyngitis, unspecified: Secondary | ICD-10-CM | POA: Diagnosis present

## 2020-07-27 DIAGNOSIS — R5381 Other malaise: Secondary | ICD-10-CM | POA: Insufficient documentation

## 2020-07-27 DIAGNOSIS — F17290 Nicotine dependence, other tobacco product, uncomplicated: Secondary | ICD-10-CM | POA: Diagnosis not present

## 2020-07-27 DIAGNOSIS — M791 Myalgia, unspecified site: Secondary | ICD-10-CM | POA: Insufficient documentation

## 2020-07-27 LAB — GROUP A STREP BY PCR: Group A Strep by PCR: DETECTED — AB

## 2020-07-27 LAB — RESP PANEL BY RT-PCR (FLU A&B, COVID) ARPGX2
Influenza A by PCR: NEGATIVE
Influenza B by PCR: NEGATIVE
SARS Coronavirus 2 by RT PCR: NEGATIVE

## 2020-07-27 MED ORDER — PENICILLIN V POTASSIUM 500 MG PO TABS: 500.0000 mg | ORAL_TABLET | Freq: Four times a day (QID) | ORAL | 0 refills | Status: AC

## 2020-07-27 NOTE — Discharge Instructions (Addendum)
You were evaluated in the Emergency Department and after careful evaluation, we did not find any emergent condition requiring admission or further testing in the hospital.  Your exam/testing today is overall reassuring.  You tested positive for strep throat.  Please take the penicillin antibiotic as directed.  Use Tylenol or Motrin at home for discomfort.  Please return to the Emergency Department if you experience any worsening of your condition.   Thank you for allowing [place] to be a part of your care.

## 2020-07-27 NOTE — ED Provider Notes (Signed)
[facility] Emergency Department Provider Note MRN:  [id number]  Arrival date & time: 07/27/20     Chief Complaint   Sore Throat   History of Present Illness   Deanna Sheppard is a 30 y.o. year-old female with no pertinent past medical history presenting to the ED with chief complaint of sore throat.  Almost 1 week of sore throat.  Also endorsing some malaise, body aches, headaches denies shortness of breath, no chest pain, no abdominal pain, no burning with urination.  Concerned she may have COVID.  Review of Systems  A problem-focused ROS was performed. Positive for sore throat.  Patient denies fever.  Patient's Health History    Past Medical History:  Diagnosis Date   Abdominal pain 11/15/2014   ASCUS with positive high risk human papillomavirus of vagina 04/16/2020   04/2020 needs colpo, immediate risk for CIN3+ is 4.4%   Cervical high risk HPV (human papillomavirus) test positive 11/17/2015   Chlamydia    Contraception management 04/13/2012   Cough 07/23/2014   History of chlamydia 06/06/2013   Irregular intermenstrual bleeding 11/15/2014   No pertinent past medical history    Pain with urination 11/15/2014   Rib pain on left side 07/23/2014   Sickle cell trait ([place])    Trichimoniasis 10/10/2019   10/10/19 treated with flagyl   Urinary frequency 10/17/2012    Past Surgical History:  Procedure Laterality Date   NO PAST SURGERIES      Family History  Adopted: Yes  Problem Relation Age of Onset   Hypertension Mother    Cancer Maternal Grandmother        breast    Diabetes Maternal Grandmother     Social History   Socioeconomic History   Marital status: Single    Spouse name: Not on file   Number of children: Not on file   Years of education: Not on file   Highest education level: Not on file  Occupational History   Not on file  Tobacco Use   Smoking status: Light Smoker    Types: Cigars   Smokeless tobacco: Never  Vaping Use    Vaping Use: Never used  Substance and Sexual Activity   Alcohol use: Yes   Drug use: Never   Sexual activity: Not Currently    Birth control/protection: Pill  Other Topics Concern   Not on file  Social History Narrative   Not on file   Social Determinants of Health   Financial Resource Strain: Medium Risk   Difficulty of Paying Living Expenses: Somewhat hard  Food Insecurity: No Food Insecurity   Worried About Charity fundraiser in the Last Year: Never true   Ran Out of Food in the Last Year: Never true  Transportation Needs: No Transportation Needs   Lack of Transportation (Medical): No   Lack of Transportation (Non-Medical): No  Physical Activity: Insufficiently Active   Days of Exercise per Week: 3 days   Minutes of Exercise per Session: 30 min  Stress: No Stress Concern Present   Feeling of Stress : Only a little  Social Connections: Socially Isolated   Frequency of Communication with Friends and Family: Three times a week   Frequency of Social Gatherings with Friends and Family: Once a week   Attends Religious Services: Never   Marine scientist or Organizations: No   Attends Archivist Meetings: Never   Marital Status: Never married  Human resources officer Violence: Not At Risk   Fear  of Current or Ex-Partner: No   Emotionally Abused: No   Physically Abused: No   Sexually Abused: No     Physical Exam   Vitals:   07/27/20 2139  BP: 105/61  Pulse: (!) 112  Resp: 16  Temp: 98.3 F (36.8 C)  SpO2: 98%    CONSTITUTIONAL: Well-appearing, NAD NEURO:  Alert and oriented x 3, no focal deficits EYES:  eyes equal and reactive ENT/NECK:  no LAD, no JVD CARDIO: Regular rate, well-perfused, normal S1 and S2 PULM:  CTAB no wheezing or rhonchi GI/GU:  normal bowel sounds, non-distended, non-tender MSK/SPINE:  No gross deformities, no edema SKIN:  no rash, atraumatic PSYCH:  Appropriate speech and behavior  *Additional and/or pertinent findings included in  MDM below  Diagnostic and Interventional Summary    EKG Interpretation  Date/Time:    Ventricular Rate:    PR Interval:    QRS Duration:   QT Interval:    QTC Calculation:   R Axis:     Text Interpretation:         Labs Reviewed  GROUP A STREP BY PCR - Abnormal; Notable for the following components:      Result Value   Group A Strep by PCR DETECTED (*)    All other components within normal limits  RESP PANEL BY RT-PCR (FLU A&B, COVID) [id number]    No orders to display    Medications - No data to display   Procedures  /  Critical Care Procedures  ED Course and Medical Decision Making  I have reviewed the triage vital signs, the nursing notes, and pertinent available records from the EMR.  Listed above are laboratory and imaging tests that I personally ordered, reviewed, and interpreted and then considered in my medical decision making (see below for details).  Sore throat for 1 week, suspect either viral illness or strep.  Rapid strep test is positive, will cover with penicillin.  Otherwise patient is breathing comfortably, normal vital signs, tachycardia resolved on my assessment.  Minimal erythema to the posterior oropharynx, no signs of abscess or significant contiguous infection.  Appropriate for discharge.  Deanna Sheppard was evaluated in Emergency Department on 07/27/2020 for the symptoms described in the history of present illness. She was evaluated in the context of the global COVID-19 pandemic, which necessitated consideration that the patient might be at risk for infection with the SARS-CoV-2 virus that causes COVID-19. Institutional protocols and algorithms that pertain to the evaluation of patients at risk for COVID-19 are in a state of rapid change based on information released by regulatory bodies including the CDC and federal and state organizations. These policies and algorithms were followed during the patient's care in the ED.        Barth Kirks. Sedonia Small,  [place] [email or web address]  Final Clinical Impressions(s) / ED Diagnoses     ICD-10-CM   1. Strep throat  J02.0       ED Discharge Orders          Ordered    penicillin v potassium (VEETID) 500 MG tablet  4 times daily        07/27/20 2326             Discharge Instructions Discussed with and Provided to Patient:     Discharge Instructions      You were evaluated in the Emergency Department and after careful evaluation, we did not find any emergent condition requiring admission or  further testing in the hospital.  Your exam/testing today is overall reassuring.  You tested positive for strep throat.  Please take the penicillin antibiotic as directed.  Use Tylenol or Motrin at home for discomfort.  Please return to the Emergency Department if you experience any worsening of your condition.   Thank you for allowing [place] to be a part of your care.        Maudie Flakes, MD 07/27/20 [phone]

## 2020-07-27 NOTE — ED Triage Notes (Signed)
Pt with sore throat x 2 weeks. Covid last month per pt.  Denies any fever or N/V.  Diarrhea last week per pt.

## 2020-08-06 ENCOUNTER — Ambulatory Visit (INDEPENDENT_AMBULATORY_CARE_PROVIDER_SITE_OTHER): Payer: Medicaid Other | Admitting: Orthopedic Surgery

## 2020-08-06 ENCOUNTER — Other Ambulatory Visit: Payer: Self-pay

## 2020-08-06 ENCOUNTER — Ambulatory Visit: Payer: Medicaid Other

## 2020-08-06 ENCOUNTER — Encounter: Payer: Self-pay | Admitting: Orthopedic Surgery

## 2020-08-06 VITALS — BP 109/73 | HR 88 | Ht 64.0 in | Wt 167.2 lb

## 2020-08-06 DIAGNOSIS — M6740 Ganglion, unspecified site: Secondary | ICD-10-CM

## 2020-08-06 NOTE — H&P (View-Only) (Signed)
New Patient Visit  Assessment: Deanna Sheppard is a 30 y.o. RHD female with the following: Right wrist ganglion cyst  Plan: Patient has noticed a cyst on the dorsal aspect of the right wrist for the past year.  She has previously had this aspirated, but the cyst returns.  Currently, it is causing some discomfort.  She is also having some radiating pains into the thumb, as well as the ulnar hand.  Due to the persistence of the growth, she is interested in having this surgically removed.  Risks and benefits of the surgery, including, but not limited to infection, bleeding, persistent pain, need for further surgery, non-union, malunion and more severe complications associated with anesthesia were discussed with the patient.  The patient has elected to proceed.  She is aware that there remains a small risk for recurrence, despite operative excision.  Surgical Plan  Procedure:  Excision of right wrist, dorsal ganglion cyst; date of surgery August 21, 2020 Disposition: Outpatient Anesthesia: General with local anesthesia Medical Comorbidities: Sickle cell trait  Follow-up: Return for After surgery: 08/21/20.  Subjective:  Chief Complaint  Patient presents with   Surgical Consult    Ganglion cyst right wrist/Dr.K patient    History of Present Illness: Deanna Sheppard is a 30 y.o. female who presents for evaluation of right wrist pain.  She first noticed a growth on the dorsal aspect of her right wrist approximately 1 year ago.  She has had the cyst aspirated multiple times.  The swelling and associated pain continue to return.  Currently, she has some issues with the range of motion, as it is uncomfortable.  In addition, she has some radiating pains into the right thumb ulnar hand.  She not noticed any overlying skin changes.  Her health is remained stable.  No recent changes in her body weight   Review of Systems: No fevers or chills No numbness or tingling No chest pain No  shortness of breath No bowel or bladder dysfunction No GI distress No headaches   Medical History:  Past Medical History:  Diagnosis Date   Abdominal pain 11/15/2014   ASCUS with positive high risk human papillomavirus of vagina 04/16/2020   04/2020 needs colpo, immediate risk for CIN3+ is 4.4%   Cervical high risk HPV (human papillomavirus) test positive 11/17/2015   Chlamydia    Contraception management 04/13/2012   Cough 07/23/2014   History of chlamydia 06/06/2013   Irregular intermenstrual bleeding 11/15/2014   No pertinent past medical history    Pain with urination 11/15/2014   Rib pain on left side 07/23/2014   Sickle cell trait ([place])    Trichimoniasis 10/10/2019   10/10/19 treated with flagyl   Urinary frequency 10/17/2012    Past Surgical History:  Procedure Laterality Date   NO PAST SURGERIES      Family History  Adopted: Yes  Problem Relation Age of Onset   Hypertension Mother    Cancer Maternal Grandmother        breast    Diabetes Maternal Grandmother    Social History   Tobacco Use   Smoking status: Light Smoker    Types: Cigars   Smokeless tobacco: Never  Vaping Use   Vaping Use: Never used  Substance Use Topics   Alcohol use: Yes   Drug use: Never    Allergies  Allergen Reactions   Bee Venom Shortness Of Breath and Swelling   Azithromycin Other (See Comments)    GI upset   Levofloxacin Other (  See Comments)    GI upset    Current Meds  Medication Sig   ipratropium (ATROVENT) 0.03 % nasal spray Place 2 sprays into both nostrils 2 (two) times daily.   norethindrone-ethinyl estradiol (LOESTRIN FE) 1-20 MG-MCG tablet Take 1 tablet by mouth daily.   promethazine-dextromethorphan (PROMETHAZINE-DM) 6.25-15 MG/5ML syrup Take 5 mLs by mouth 4 (four) times daily as needed for cough.    Objective: BP 109/73   Pulse 88   Ht '5\' 4"'$  (1.626 m)   Wt 167 lb 3.2 oz (75.8 kg)   LMP 07/28/2020 (Exact Date)   BMI 28.70 kg/m   Physical Exam:  General:  Alert and oriented. and No acute distress. Gait: Normal gait.  Evaluation of the right wrist demonstrates an obvious deformity on the dorsal aspect.  This growth is compressible.  No overlying skin changes.  Negative Tinel's over the growth.  There is some tenderness to deep palpation.  Slightly restricted extension due to pain.  Fingers are warm and well-perfused.  2+ radial pulse.   IMAGING: I personally ordered and reviewed the following images  X-rays of the right wrist were obtained in clinic today and demonstrates no acute injuries.  No evidence of a previous fracture.  Soft tissue swelling of the dorsum of the wrist is appreciated.  Well-maintained joint spaces throughout her wrist and hand.  Impression: Normal right wrist x-rays   New Medications:  No orders of the defined types were placed in this encounter.     Mordecai Rasmussen, MD  08/06/2020 11:05 PM

## 2020-08-06 NOTE — Progress Notes (Signed)
New Patient Visit  Assessment: Deanna Sheppard is a 30 y.o. RHD female with the following: Right wrist ganglion cyst  Plan: Patient has noticed a cyst on the dorsal aspect of the right wrist for the past year.  She has previously had this aspirated, but the cyst returns.  Currently, it is causing some discomfort.  She is also having some radiating pains into the thumb, as well as the ulnar hand.  Due to the persistence of the growth, she is interested in having this surgically removed.  Risks and benefits of the surgery, including, but not limited to infection, bleeding, persistent pain, need for further surgery, non-union, malunion and more severe complications associated with anesthesia were discussed with the patient.  The patient has elected to proceed.  She is aware that there remains a small risk for recurrence, despite operative excision.  Surgical Plan  Procedure:  Excision of right wrist, dorsal ganglion cyst; date of surgery August 21, 2020 Disposition: Outpatient Anesthesia: General with local anesthesia Medical Comorbidities: Sickle cell trait  Follow-up: Return for After surgery: 08/21/20.  Subjective:  Chief Complaint  Patient presents with   Surgical Consult    Ganglion cyst right wrist/Dr.K patient    History of Present Illness: Deanna Sheppard is a 30 y.o. female who presents for evaluation of right wrist pain.  She first noticed a growth on the dorsal aspect of her right wrist approximately 1 year ago.  She has had the cyst aspirated multiple times.  The swelling and associated pain continue to return.  Currently, she has some issues with the range of motion, as it is uncomfortable.  In addition, she has some radiating pains into the right thumb ulnar hand.  She not noticed any overlying skin changes.  Her health is remained stable.  No recent changes in her body weight   Review of Systems: No fevers or chills No numbness or tingling No chest pain No  shortness of breath No bowel or bladder dysfunction No GI distress No headaches   Medical History:  Past Medical History:  Diagnosis Date   Abdominal pain 11/15/2014   ASCUS with positive high risk human papillomavirus of vagina 04/16/2020   04/2020 needs colpo, immediate risk for CIN3+ is 4.4%   Cervical high risk HPV (human papillomavirus) test positive 11/17/2015   Chlamydia    Contraception management 04/13/2012   Cough 07/23/2014   History of chlamydia 06/06/2013   Irregular intermenstrual bleeding 11/15/2014   No pertinent past medical history    Pain with urination 11/15/2014   Rib pain on left side 07/23/2014   Sickle cell trait ([place])    Trichimoniasis 10/10/2019   10/10/19 treated with flagyl   Urinary frequency 10/17/2012    Past Surgical History:  Procedure Laterality Date   NO PAST SURGERIES      Family History  Adopted: Yes  Problem Relation Age of Onset   Hypertension Mother    Cancer Maternal Grandmother        breast    Diabetes Maternal Grandmother    Social History   Tobacco Use   Smoking status: Light Smoker    Types: Cigars   Smokeless tobacco: Never  Vaping Use   Vaping Use: Never used  Substance Use Topics   Alcohol use: Yes   Drug use: Never    Allergies  Allergen Reactions   Bee Venom Shortness Of Breath and Swelling   Azithromycin Other (See Comments)    GI upset   Levofloxacin Other (  See Comments)    GI upset    Current Meds  Medication Sig   ipratropium (ATROVENT) 0.03 % nasal spray Place 2 sprays into both nostrils 2 (two) times daily.   norethindrone-ethinyl estradiol (LOESTRIN FE) 1-20 MG-MCG tablet Take 1 tablet by mouth daily.   promethazine-dextromethorphan (PROMETHAZINE-DM) 6.25-15 MG/5ML syrup Take 5 mLs by mouth 4 (four) times daily as needed for cough.    Objective: BP 109/73   Pulse 88   Ht '5\' 4"'$  (1.626 m)   Wt 167 lb 3.2 oz (75.8 kg)   LMP 07/28/2020 (Exact Date)   BMI 28.70 kg/m   Physical Exam:  General:  Alert and oriented. and No acute distress. Gait: Normal gait.  Evaluation of the right wrist demonstrates an obvious deformity on the dorsal aspect.  This growth is compressible.  No overlying skin changes.  Negative Tinel's over the growth.  There is some tenderness to deep palpation.  Slightly restricted extension due to pain.  Fingers are warm and well-perfused.  2+ radial pulse.   IMAGING: I personally ordered and reviewed the following images  X-rays of the right wrist were obtained in clinic today and demonstrates no acute injuries.  No evidence of a previous fracture.  Soft tissue swelling of the dorsum of the wrist is appreciated.  Well-maintained joint spaces throughout her wrist and hand.  Impression: Normal right wrist x-rays   New Medications:  No orders of the defined types were placed in this encounter.     Mordecai Rasmussen, MD  08/06/2020 11:05 PM

## 2020-08-13 NOTE — Patient Instructions (Signed)
Your procedure is scheduled on: 08/21/2020  Report to [facility] at   6:15  AM.  Call this number if you have problems the morning of surgery: [phone]   Remember:   Do not Eat or Drink after midnight         No Smoking the morning of surgery  :  Take these medicines the morning of surgery with A SIP OF WATER: none   Do not wear jewelry, make-up or nail polish.  Do not wear lotions, powders, or perfumes. You may wear deodorant.  Do not shave 48 hours prior to surgery. Men may shave face and neck.  Do not bring valuables to the hospital.  Contacts, dentures or bridgework may not be worn into surgery.  Leave suitcase in the car. After surgery it may be brought to your room.  For patients admitted to the hospital, checkout time is 11:00 AM the day of discharge.   Patients discharged the day of surgery will not be allowed to drive home.    Special Instructions: Shower using CHG night before surgery and shower the day of surgery use CHG.  Use special wash - you have one bottle of CHG for all showers.  You should use approximately 1/2 of the bottle for each shower.   Ganglion Cyst Removal, Care After This sheet gives you information about how to care for yourself after your procedure. Your health care provider may also give you more specific instructions. If you have problems or questions, contact your health careprovider. What can I expect after the procedure? After the procedure, it is common to have: Soreness. Swelling. Stiffness. A splint or brace. Follow these instructions at home: Medicines Take over-the-counter and prescription medicines only as told by your health care provider. Ask your health care provider if the medicine prescribed to you requires you to avoid driving or using machinery. Incision care  Follow instructions from your health care provider about how to take care of your incision or incisions. Make sure you: Wash your hands with soap and  water for at least 20 seconds before and after you change your bandage (dressing). If soap and water are not available, use hand sanitizer. Change and remove your dressing as told by your health care provider. Leave stitches (sutures), skin glue, or adhesive strips in place. These skin closures may need to stay in place for 2 weeks or longer. If adhesive strip edges start to loosen and curl up, you may trim the loose edges. Do not remove adhesive strips completely unless your health care provider tells you to do that. Check your incision area every day for signs of infection. Check for: Redness, swelling, or pain. Fluid or blood. Warmth. Pus or a bad smell. Do not take baths, swim, or use a hot tub until your health care provider approves.  If you have a splint or brace: Wear it as told by your health care provider. Remove it only as told by your health care provider. You may need to wear the splint or brace for several days. Loosen the splint or brace if your fingers (or toes) tingle, become numb, or turn cold and blue. Keep the splint or brace clean. If the splint or brace is not waterproof: Do not let it get wet. Ask if you can remove it for bathing. If not, cover it with a watertight covering when you take a bath or shower. Managing pain, stiffness, and swelling  If directed, put ice on the incision  area. To do this: If you have a removable splint or brace, remove it as told by your health care provider. Put ice in a plastic bag. Place a towel between your skin and the bag or between the splint or brace and the bag. Leave the ice on for 20 minutes, 2-3 times a day. Move your fingers (or toes) often to reduce stiffness and swelling. Raise (elevate) the affected area above the level of your heart while you are sitting or lying down.  Activity Return to your normal activities as told by your health care provider. Ask your health care provider what activities are safe for you. Avoid  activities that cause pain. Ask your health care provider when it is safe to drive and when you should start doing movement exercises. General instructions Do not use any products that contain nicotine or tobacco, such as cigarettes, e-cigarettes, and chewing tobacco. These can delay healing. If you need help quitting, ask your health care provider. Keep all follow-up visits as told by your health care provider. This is important. Contact a health care provider if: Medicine is not relieving your pain. You have stiffness or swelling that gets worse. Your splint or brace is not fitting correctly, causing your fingers (or toes) to tingle, become numb, or turn cold and blue. You have any signs of infection at your incision site. You have a fever. Summary After the procedure, you can expect to have some soreness, stiffness, and swelling. You may need to wear a splint or brace for a few days. Follow instructions for changing your dressing and checking your incision area for signs of infection. Contact your health care provider if you have a fever, signs of infection, stiffness or swelling that gets worse, or continuing pain, tingling, or numbness. This information is not intended to replace advice given to you by your health care provider. Make sure you discuss any questions you have with your healthcare provider. Document Revised: 03/16/2019 Document Reviewed: 03/16/2019 Elsevier Patient Education  2022 [place]. How to Use Chlorhexidine for Bathing Chlorhexidine gluconate (CHG) is a germ-killing (antiseptic) solution that is used to clean the skin. It can get rid of the bacteria that normally live on the skin and can keep them away for about 24 hours. To clean your skin with CHG, you may be given: A CHG solution to use in the shower or as part of a sponge bath. A prepackaged cloth that contains CHG. Cleaning your skin with CHG may help lower the risk for infection: While you are staying in  the intensive care unit of the hospital. If you have a vascular access, such as a central line, to provide short-term or long-term access to your veins. If you have a catheter to drain urine from your bladder. If you are on a ventilator. A ventilator is a machine that helps you breathe by moving air in and out of your lungs. After surgery. What are the risks? Risks of using CHG include: A skin reaction. Hearing loss, if CHG gets in your ears. Eye injury, if CHG gets in your eyes and is not rinsed out. The CHG product catching fire. Make sure that you avoid smoking and flames after applying CHG to your skin. Do not use CHG: If you have a chlorhexidine allergy or have previously reacted to chlorhexidine. On babies younger than 31 months of age. How to use CHG solution Use CHG only as told by your health care provider, and follow the instructions on the label.  Use the full amount of CHG as directed. Usually, this is one bottle. During a shower Follow these steps when using CHG solution during a shower (unless your health care provider gives you different instructions): Start the shower. Use your normal soap and shampoo to wash your face and hair. Turn off the shower or move out of the shower stream. Pour the CHG onto a clean washcloth. Do not use any type of brush or rough-edged sponge. Starting at your neck, lather your body down to your toes. Make sure you follow these instructions: If you will be having surgery, pay special attention to the part of your body where you will be having surgery. Scrub this area for at least 1 minute. Do not use CHG on your head or face. If the solution gets into your ears or eyes, rinse them well with water. Avoid your genital area. Avoid any areas of skin that have broken skin, cuts, or scrapes. Scrub your back and under your arms. Make sure to wash skin folds. Let the lather sit on your skin for 1-2 minutes or as long as told by your health care  provider. Thoroughly rinse your entire body in the shower. Make sure that all body creases and crevices are rinsed well. Dry off with a clean towel. Do not put any substances on your body afterward--such as powder, lotion, or perfume--unless you are told to do so by your health care provider. Only use lotions that are recommended by the manufacturer. Put on clean clothes or pajamas. If it is the night before your surgery, sleep in clean sheets.  During a sponge bath Follow these steps when using CHG solution during a sponge bath (unless your health care provider gives you different instructions): Use your normal soap and shampoo to wash your face and hair. Pour the CHG onto a clean washcloth. Starting at your neck, lather your body down to your toes. Make sure you follow these instructions: If you will be having surgery, pay special attention to the part of your body where you will be having surgery. Scrub this area for at least 1 minute. Do not use CHG on your head or face. If the solution gets into your ears or eyes, rinse them well with water. Avoid your genital area. Avoid any areas of skin that have broken skin, cuts, or scrapes. Scrub your back and under your arms. Make sure to wash skin folds. Let the lather sit on your skin for 1-2 minutes or as long as told by your health care provider. Using a different clean, wet washcloth, thoroughly rinse your entire body. Make sure that all body creases and crevices are rinsed well. Dry off with a clean towel. Do not put any substances on your body afterward--such as powder, lotion, or perfume--unless you are told to do so by your health care provider. Only use lotions that are recommended by the manufacturer. Put on clean clothes or pajamas. If it is the night before your surgery, sleep in clean sheets. How to use CHG prepackaged cloths Only use CHG cloths as told by your health care provider, and follow the instructions on the label. Use the  CHG cloth on clean, dry skin. Do not use the CHG cloth on your head or face unless your health care provider tells you to. When washing with the CHG cloth: Avoid your genital area. Avoid any areas of skin that have broken skin, cuts, or scrapes. Before surgery Follow these steps when using a CHG cloth  to clean before surgery (unless your health care provider gives you different instructions): Using the CHG cloth, vigorously scrub the part of your body where you will be having surgery. Scrub using a back-and-forth motion for 3 minutes. The area on your body should be completely wet with CHG when you are done scrubbing. Do not rinse. Discard the cloth and let the area air-dry. Do not put any substances on the area afterward, such as powder, lotion, or perfume. Put on clean clothes or pajamas. If it is the night before your surgery, sleep in clean sheets.  For general bathing Follow these steps when using CHG cloths for general bathing (unless your health care provider gives you different instructions). Use a separate CHG cloth for each area of your body. Make sure you wash between any folds of skin and between your fingers and toes. Wash your body in the following order, switching to a new cloth after each step: The front of your neck, shoulders, and chest. Both of your arms, under your arms, and your hands. Your stomach and groin area, avoiding the genitals. Your right leg and foot. Your left leg and foot. The back of your neck, your back, and your buttocks. Do not rinse. Discard the cloth and let the area air-dry. Do not put any substances on your body afterward--such as powder, lotion, or perfume--unless you are told to do so by your health care provider. Only use lotions that are recommended by the manufacturer. Put on clean clothes or pajamas. Contact a health care provider if: Your skin gets irritated after scrubbing. You have questions about using your solution or cloth. Get help right  away if: Your eyes become very red or swollen. Your eyes itch badly. Your skin itches badly and is red or swollen. Your hearing changes. You have trouble seeing. You have swelling or tingling in your mouth or throat. You have trouble breathing. You swallow any chlorhexidine. Summary Chlorhexidine gluconate (CHG) is a germ-killing (antiseptic) solution that is used to clean the skin. Cleaning your skin with CHG may help to lower your risk for infection. You may be given CHG to use for bathing. It may be in a bottle or in a prepackaged cloth to use on your skin. Carefully follow your health care provider's instructions and the instructions on the product label. Do not use CHG if you have a chlorhexidine allergy. Contact your health care provider if your skin gets irritated after scrubbing. This information is not intended to replace advice given to you by your health care provider. Make sure you discuss any questions you have with your healthcare provider. Document Revised: 05/04/2019 Document Reviewed: 06/08/2019 Elsevier Patient Education  [place] After This sheet gives you information about how to care for yourself after your procedure. Your health care provider may also give you more specific instructions. If you have problems or questions, contact your health careprovider. What can I expect after the procedure? After the procedure, it is common to have: Tiredness. Forgetfulness about what happened after the procedure. Impaired judgment for important decisions. Nausea or vomiting. Some difficulty with balance. Follow these instructions at home: For the time period you were told by your health care provider:     Rest as needed. Do not participate in activities where you could fall or become injured. Do not drive or use machinery. Do not drink alcohol. Do not take sleeping pills or medicines that cause drowsiness. Do not make important  decisions or  sign legal documents. Do not take care of children on your own. Eating and drinking Follow the diet that is recommended by your health care provider. Drink enough fluid to keep your urine pale yellow. If you vomit: Drink water, juice, or soup when you can drink without vomiting. Make sure you have little or no nausea before eating solid foods. General instructions Have a responsible adult stay with you for the time you are told. It is important to have someone help care for you until you are awake and alert. Take over-the-counter and prescription medicines only as told by your health care provider. If you have sleep apnea, surgery and certain medicines can increase your risk for breathing problems. Follow instructions from your health care provider about wearing your sleep device: Anytime you are sleeping, including during daytime naps. While taking prescription pain medicines, sleeping medicines, or medicines that make you drowsy. Avoid smoking. Keep all follow-up visits as told by your health care provider. This is important. Contact a health care provider if: You keep feeling nauseous or you keep vomiting. You feel light-headed. You are still sleepy or having trouble with balance after 24 hours. You develop a rash. You have a fever. You have redness or swelling around the IV site. Get help right away if: You have trouble breathing. You have new-onset confusion at home. Summary For several hours after your procedure, you may feel tired. You may also be forgetful and have poor judgment. Have a responsible adult stay with you for the time you are told. It is important to have someone help care for you until you are awake and alert. Rest as told. Do not drive or operate machinery. Do not drink alcohol or take sleeping pills. Get help right away if you have trouble breathing, or if you suddenly become confused. This information is not intended to replace advice given to you  by your health care provider. Make sure you discuss any questions you have with your healthcare provider. Document Revised: 09/06/2019 Document Reviewed: 11/23/2018 Elsevier Patient Education  2022 [employer].

## 2020-08-18 ENCOUNTER — Telehealth: Payer: Self-pay | Admitting: Orthopedic Surgery

## 2020-08-18 NOTE — Telephone Encounter (Signed)
Sher from the [facility] is calling about the authorization for surgery  DOS is 08/21/20.  She does not see one on file.    Please call her back at  [phone] ext [phone]  Thanks,

## 2020-08-19 ENCOUNTER — Other Ambulatory Visit: Payer: Self-pay

## 2020-08-19 ENCOUNTER — Encounter (HOSPITAL_COMMUNITY)
Admission: RE | Admit: 2020-08-19 | Discharge: 2020-08-19 | Disposition: A | Discharge status: Home / Self Care | Payer: Medicaid Other | Source: Ambulatory Visit | Attending: Orthopedic Surgery | Admitting: Orthopedic Surgery

## 2020-08-19 ENCOUNTER — Encounter (HOSPITAL_COMMUNITY): Payer: Self-pay

## 2020-08-19 DIAGNOSIS — Z01812 Encounter for preprocedural laboratory examination: Secondary | ICD-10-CM | POA: Diagnosis present

## 2020-08-19 HISTORY — DX: Unspecified asthma, uncomplicated: J45.909

## 2020-08-19 LAB — CBC
HCT: 37.4 % (ref 36.0–46.0)
Hemoglobin: 12.8 g/dL (ref 12.0–15.0)
MCH: 29.4 pg (ref 26.0–34.0)
MCHC: 34.2 g/dL (ref 30.0–36.0)
MCV: 85.8 fL (ref 80.0–100.0)
Platelets: 375 10*3/uL (ref 150–400)
RBC: 4.36 MIL/uL (ref 3.87–5.11)
RDW: 13.2 % (ref 11.5–15.5)
WBC: 12.9 10*3/uL — ABNORMAL HIGH (ref 4.0–10.5)
nRBC: 0 % (ref 0.0–0.2)

## 2020-08-19 LAB — HCG, SERUM, QUALITATIVE: Preg, Serum: NEGATIVE

## 2020-08-19 LAB — BASIC METABOLIC PANEL
Anion gap: 6 (ref 5–15)
BUN: 6 mg/dL (ref 6–20)
CO2: 28 mmol/L (ref 22–32)
Calcium: 9.2 mg/dL (ref 8.9–10.3)
Chloride: 102 mmol/L (ref 98–111)
Creatinine, Ser: 0.63 mg/dL (ref 0.44–1.00)
GFR, Estimated: >60 mL/min (ref >60)
Glucose, Bld: 112 mg/dL — ABNORMAL HIGH (ref 70–99)
Potassium: 3.7 mmol/L (ref 3.5–5.1)
Sodium: 136 mmol/L (ref 135–145)

## 2020-08-21 ENCOUNTER — Ambulatory Visit (HOSPITAL_COMMUNITY): Payer: Medicaid Other | Admitting: Certified Registered"

## 2020-08-21 ENCOUNTER — Encounter (HOSPITAL_COMMUNITY): Payer: Self-pay | Admitting: Orthopedic Surgery

## 2020-08-21 ENCOUNTER — Encounter (HOSPITAL_COMMUNITY)
Admission: RE | Disposition: A | Discharge status: Home / Self Care | Payer: Self-pay | Source: Home / Self Care | Attending: Orthopedic Surgery

## 2020-08-21 ENCOUNTER — Ambulatory Visit (HOSPITAL_COMMUNITY)
Admission: RE | Admit: 2020-08-21 | Discharge: 2020-08-21 | Disposition: A | Discharge status: Home / Self Care | Payer: Medicaid Other | Attending: Orthopedic Surgery | Admitting: Orthopedic Surgery

## 2020-08-21 DIAGNOSIS — Z881 Allergy status to other antibiotic agents status: Secondary | ICD-10-CM | POA: Diagnosis not present

## 2020-08-21 DIAGNOSIS — F17290 Nicotine dependence, other tobacco product, uncomplicated: Secondary | ICD-10-CM | POA: Diagnosis not present

## 2020-08-21 DIAGNOSIS — Z79899 Other long term (current) drug therapy: Secondary | ICD-10-CM | POA: Diagnosis not present

## 2020-08-21 DIAGNOSIS — Z793 Long term (current) use of hormonal contraceptives: Secondary | ICD-10-CM | POA: Diagnosis not present

## 2020-08-21 DIAGNOSIS — D571 Sickle-cell disease without crisis: Secondary | ICD-10-CM | POA: Diagnosis not present

## 2020-08-21 DIAGNOSIS — M67431 Ganglion, right wrist: Secondary | ICD-10-CM | POA: Diagnosis present

## 2020-08-21 HISTORY — PX: GANGLION CYST EXCISION: SHX1691

## 2020-08-21 SURGERY — EXCISION, GANGLION CYST, WRIST
Anesthesia: General | Site: Wrist | Laterality: Right

## 2020-08-21 MED ORDER — GLYCOPYRROLATE PF 0.2 MG/ML IJ SOSY
PREFILLED_SYRINGE | INTRAMUSCULAR | Status: DC | PRN
  Administered 2020-08-21 (×2): .1 mg via INTRAVENOUS

## 2020-08-21 MED ORDER — ONDANSETRON HCL 4 MG/2ML IJ SOLN
INTRAMUSCULAR | Status: DC | PRN
  Administered 2020-08-21: 4 mg via INTRAVENOUS

## 2020-08-21 MED ORDER — ONDANSETRON HCL 4 MG/2ML IJ SOLN
INTRAMUSCULAR | Status: AC
  Filled 2020-08-21: qty 2

## 2020-08-21 MED ORDER — ORAL CARE MOUTH RINSE: 15.0000 mL | Freq: Once | OROMUCOSAL | Status: DC

## 2020-08-21 MED ORDER — CEFAZOLIN SODIUM-DEXTROSE 2-4 GM/100ML-% IV SOLN
2.0000 g | INTRAVENOUS | Status: AC
  Administered 2020-08-21: 2 g via INTRAVENOUS

## 2020-08-21 MED ORDER — DEXMEDETOMIDINE (PRECEDEX) IN NS 20 MCG/5ML (4 MCG/ML) IV SYRINGE
PREFILLED_SYRINGE | INTRAVENOUS | Status: AC
  Filled 2020-08-21: qty 10

## 2020-08-21 MED ORDER — ONDANSETRON HCL 4 MG PO TABS: 4.0000 mg | ORAL_TABLET | Freq: Three times a day (TID) | ORAL | 0 refills | Status: AC | PRN

## 2020-08-21 MED ORDER — KETOROLAC TROMETHAMINE 30 MG/ML IJ SOLN
INTRAMUSCULAR | Status: DC | PRN
  Administered 2020-08-21: 30 mg via INTRAVENOUS

## 2020-08-21 MED ORDER — KETOROLAC TROMETHAMINE 30 MG/ML IJ SOLN
INTRAMUSCULAR | Status: AC
  Filled 2020-08-21: qty 1

## 2020-08-21 MED ORDER — CHLORHEXIDINE GLUCONATE 0.12 % MT SOLN: 15.0000 mL | Freq: Once | OROMUCOSAL | Status: DC

## 2020-08-21 MED ORDER — PROPOFOL 10 MG/ML IV BOLUS
INTRAVENOUS | Status: DC | PRN
  Administered 2020-08-21: 200 mg via INTRAVENOUS

## 2020-08-21 MED ORDER — DEXAMETHASONE SODIUM PHOSPHATE 4 MG/ML IJ SOLN
INTRAMUSCULAR | Status: DC | PRN
  Administered 2020-08-21: 4 mg via INTRAVENOUS

## 2020-08-21 MED ORDER — DEXAMETHASONE SODIUM PHOSPHATE 10 MG/ML IJ SOLN
INTRAMUSCULAR | Status: AC
  Filled 2020-08-21: qty 1

## 2020-08-21 MED ORDER — LIDOCAINE HCL (PF) 2 % IJ SOLN
INTRAMUSCULAR | Status: AC
  Filled 2020-08-21: qty 5

## 2020-08-21 MED ORDER — SODIUM CHLORIDE 0.9 % IR SOLN
Status: DC | PRN
  Administered 2020-08-21: 500 mL

## 2020-08-21 MED ORDER — FENTANYL CITRATE PF 50 MCG/ML IJ SOSY: 25.0000 ug | PREFILLED_SYRINGE | INTRAMUSCULAR | Status: DC | PRN

## 2020-08-21 MED ORDER — CEFAZOLIN SODIUM-DEXTROSE 2-4 GM/100ML-% IV SOLN
INTRAVENOUS | Status: AC
  Filled 2020-08-21: qty 100

## 2020-08-21 MED ORDER — ONDANSETRON HCL 4 MG/2ML IJ SOLN: 4.0000 mg | Freq: Once | INTRAMUSCULAR | Status: DC | PRN

## 2020-08-21 MED ORDER — CHLORHEXIDINE GLUCONATE 0.12 % MT SOLN
OROMUCOSAL | Status: AC
  Filled 2020-08-21: qty 15

## 2020-08-21 MED ORDER — FENTANYL CITRATE (PF) 100 MCG/2ML IJ SOLN
INTRAMUSCULAR | Status: AC
  Filled 2020-08-21: qty 2

## 2020-08-21 MED ORDER — BUPIVACAINE HCL (PF) 0.5 % IJ SOLN
INTRAMUSCULAR | Status: AC
  Filled 2020-08-21: qty 30

## 2020-08-21 MED ORDER — MIDAZOLAM HCL 5 MG/5ML IJ SOLN
INTRAMUSCULAR | Status: DC | PRN
  Administered 2020-08-21: 2 mg via INTRAVENOUS

## 2020-08-21 MED ORDER — CELECOXIB 100 MG PO CAPS: 100.0000 mg | ORAL_CAPSULE | Freq: Every day | ORAL | 0 refills | Status: AC

## 2020-08-21 MED ORDER — FENTANYL CITRATE (PF) 100 MCG/2ML IJ SOLN
INTRAMUSCULAR | Status: DC | PRN
  Administered 2020-08-21 (×3): 50 ug via INTRAVENOUS

## 2020-08-21 MED ORDER — HYDROCODONE-ACETAMINOPHEN 5-325 MG PO TABS: 1.0000 | ORAL_TABLET | Freq: Four times a day (QID) | ORAL | 0 refills | Status: AC | PRN

## 2020-08-21 MED ORDER — BUPIVACAINE HCL (PF) 0.5 % IJ SOLN
INTRAMUSCULAR | Status: DC | PRN
  Administered 2020-08-21: 5 mL

## 2020-08-21 MED ORDER — MIDAZOLAM HCL 2 MG/2ML IJ SOLN
INTRAMUSCULAR | Status: AC
  Filled 2020-08-21: qty 2

## 2020-08-21 MED ORDER — DEXMEDETOMIDINE (PRECEDEX) IN NS 20 MCG/5ML (4 MCG/ML) IV SYRINGE
PREFILLED_SYRINGE | INTRAVENOUS | Status: DC | PRN
  Administered 2020-08-21 (×2): 20 ug via INTRAVENOUS

## 2020-08-21 MED ORDER — GLYCOPYRROLATE PF 0.2 MG/ML IJ SOSY
PREFILLED_SYRINGE | INTRAMUSCULAR | Status: AC
  Filled 2020-08-21: qty 1

## 2020-08-21 MED ORDER — MEPERIDINE HCL 50 MG/ML IJ SOLN: 6.2500 mg | INTRAMUSCULAR | Status: DC | PRN

## 2020-08-21 MED ORDER — LIDOCAINE 2% (20 MG/ML) 5 ML SYRINGE
INTRAMUSCULAR | Status: DC | PRN
Start: 2020-08-21 — End: 2020-08-21
  Administered 2020-08-21: 100 mg via INTRAVENOUS

## 2020-08-21 MED ORDER — LACTATED RINGERS IV SOLN: INTRAVENOUS | Status: DC

## 2020-08-21 MED ORDER — PROPOFOL 10 MG/ML IV BOLUS
INTRAVENOUS | Status: AC
  Filled 2020-08-21: qty 40

## 2020-08-21 SURGICAL SUPPLY — 34 items
APL PRP STRL LF DISP 70% ISPRP (MISCELLANEOUS) ×1
BANDAGE ESMARK 4X12 BL STRL LF (DISPOSABLE) ×1 IMPLANT
BLADE SURG 15 STRL LF DISP TIS (BLADE) ×1 IMPLANT
BLADE SURG 15 STRL SS (BLADE) ×2
BNDG CMPR 12X4 ELC STRL LF (DISPOSABLE) ×1
BNDG CMPR STD VLCR NS LF 5.8X3 (GAUZE/BANDAGES/DRESSINGS) ×1
BNDG COHESIVE 4X5 TAN STRL (GAUZE/BANDAGES/DRESSINGS) IMPLANT
BNDG ELASTIC 3X5.8 VLCR NS LF (GAUZE/BANDAGES/DRESSINGS) ×2 IMPLANT
BNDG ESMARK 4X12 BLUE STRL LF (DISPOSABLE) ×2
BNDG GAUZE ELAST 4 BULKY (GAUZE/BANDAGES/DRESSINGS) ×2 IMPLANT
CHLORAPREP W/TINT 26 (MISCELLANEOUS) ×2 IMPLANT
CLOTH BEACON ORANGE TIMEOUT ST (SAFETY) ×2 IMPLANT
COVER LIGHT HANDLE STERIS (MISCELLANEOUS) ×4 IMPLANT
CUFF TOURN SGL QUICK 18X4 (TOURNIQUET CUFF) ×2 IMPLANT
GAUZE SPONGE 4X4 12PLY STRL (GAUZE/BANDAGES/DRESSINGS) ×2 IMPLANT
GAUZE XEROFORM 1X8 LF (GAUZE/BANDAGES/DRESSINGS) ×2 IMPLANT
GLOVE SRG 8 PF TXTR STRL LF DI (GLOVE) ×1 IMPLANT
GLOVE SURG POLYISO LF SZ8 (GLOVE) ×2 IMPLANT
GLOVE SURG UNDER POLY LF SZ7 (GLOVE) ×4 IMPLANT
GLOVE SURG UNDER POLY LF SZ8 (GLOVE) ×2
GOWN STRL REUS W/ TWL XL LVL3 (GOWN DISPOSABLE) ×1 IMPLANT
GOWN STRL REUS W/TWL LRG LVL3 (GOWN DISPOSABLE) ×2 IMPLANT
GOWN STRL REUS W/TWL XL LVL3 (GOWN DISPOSABLE) ×2
KIT TURNOVER KIT A (KITS) ×2 IMPLANT
MANIFOLD NEPTUNE II (INSTRUMENTS) ×2 IMPLANT
NEEDLE HYPO 21X1.5 SAFETY (NEEDLE) ×2 IMPLANT
NS IRRIG 1000ML POUR BTL (IV SOLUTION) ×2 IMPLANT
PACK BASIC LIMB (CUSTOM PROCEDURE TRAY) ×2 IMPLANT
PAD ARMBOARD 7.5X6 YLW CONV (MISCELLANEOUS) ×2 IMPLANT
SET BASIN LINEN APH (SET/KITS/TRAYS/PACK) ×2 IMPLANT
SUT MON AB 2-0 SH 27 (SUTURE)
SUT MON AB 2-0 SH27 (SUTURE) IMPLANT
SUT PROLENE 3 0 PS 2 (SUTURE) IMPLANT
SYR CONTROL 10ML LL (SYRINGE) ×2 IMPLANT

## 2020-08-21 NOTE — Progress Notes (Signed)
Instructed on incentive spirometer. 1250 ml obtained. Tolerated well. 

## 2020-08-21 NOTE — Anesthesia Postprocedure Evaluation (Signed)
Anesthesia Post Note  Patient: Deanna Sheppard  Procedure(s) Performed: EXCISION OF CYST FROM DORSAL RIGHT WRIST (Right: Wrist)  Patient location during evaluation: PACU Anesthesia Type: General Level of consciousness: awake and alert and oriented Pain management: pain level controlled Vital Signs Assessment: post-procedure vital signs reviewed and stable Respiratory status: spontaneous breathing and respiratory function stable Cardiovascular status: stable and blood pressure returned to baseline Postop Assessment: no apparent nausea or vomiting Anesthetic complications: no   No notable events documented.   Last Vitals:  Vitals:   08/21/20 0915 08/21/20 0934  BP: 120/72 98/68  Pulse: (!) 57 61  Resp: 14 18  Temp:  36.6 C  SpO2: 98% 98%    Last Pain:  Vitals:   08/21/20 0934  TempSrc: Oral  PainSc: 0-No pain                 Deanna Sheppard

## 2020-08-21 NOTE — Discharge Instructions (Signed)
  Itha Kroeker A. Amedeo Kinsman, MD [facility] [address] [place],  [place]  [address] Phone: [phone] Fax: [phone]    [facility] may remove your bandage on postop day 3 and get the hand wet.  You can cover the stitches with a regular dressing as needed. No ointments or lotions to be applied to the incision.  Do not submerge the incision for 1 month.  FOLLOW-UP If you develop a Fever (>101.5), Redness or Drainage from the surgical incision site, please call our office to arrange for an evaluation.  Please call the office to schedule a follow-up appointment for your incision check if you do not already have one, 10-14 days post-operatively.  IF YOU HAVE ANY QUESTIONS, PLEASE FEEL FREE TO CALL OUR OFFICE.  HELPFUL INFORMATION  You should wean off your narcotic medicines as soon as you are able.  Most patients will be off or using minimal narcotics before their first postop appointment.   You may be more comfortable sleeping in a semi-seated position the first few nights following surgery.  Keep a pillow propped under the elbow and forearm for comfort.  If you have a recliner type of chair it might be beneficial.    We suggest you use the pain medication the first night prior to going to bed, in order to ease any pain when the anesthesia wears off. You should avoid taking pain medications on an empty stomach as it will make you nauseous.  Do not drink alcoholic beverages or take illicit drugs when taking pain medications.  You may return to work/school in the next couple of days when you feel up to it. Desk work and typing is fine.  Pain medication may make you constipated.  Below are a few solutions to try in this order: Decrease the amount of pain medication if you aren't having pain. Drink lots of decaffeinated fluids. Drink prune juice and/or each dried prunes  If the first 3 don't work start with additional  solutions Take Colace - an over-the-counter stool softener Take Senokot - an over-the-counter laxative Take Miralax - a stronger over-the-counter laxative

## 2020-08-21 NOTE — Op Note (Signed)
Orthopaedic Surgery Operative Note (CSN: [id number])  Deanna Sheppard  [birth year] Date of Surgery: 08/21/2020   Diagnoses:  Dorsal Right wrist ganglion cyst  Procedure: Excision of ganglion cyst from dorsal right wrist   Operative Finding Successful completion of the planned procedure.  Cyst excised, containing thick mucinous material, small amount of hemorrhagic mucinous material.   Post-Op Diagnosis: Same Surgeons:Primary: Mordecai Rasmussen, MD Assistants: N/A Location: [facility] OR ROOM 4 Anesthesia: General with local anesthesia Antibiotics: Ancef 2 g Tourniquet time:  Total Tourniquet Time Documented: Upper Arm (Right) - 34 minutes Total: Upper Arm (Right) - 34 minutes  Estimated Blood Loss: Minimal Complications: None Specimens: None Implants: * No implants in log *  Indications for Surgery:   Deanna Sheppard is a 30 y.o. female who has had a cystic growth on her dorsal right wrist for the past year.  She has had it aspirated multiple times, but the cyst returns.  Due to the location of the cyst, it is current painful, and is causing some radiating pains into her hand.  As a result, she would like it excised to reduce the likelihood that it will return.  Benefits and risks of operative and nonoperative management were discussed prior to surgery with the patient and informed consent form was completed.  Specific risks including infection, need for additional surgery, damage to surrounding structures such as nerves and blood vessels, bleeding, persistent pain, recurrence of the cyst and more severe complications associated with anesthesia were discussed.  She elected to proceed.    Procedure:   The patient was identified properly. Informed consent was obtained and the surgical site was marked. The patient was taken up to suite where general anesthesia was induced.  The patient was positioned supine, on a hand table.  The right arm was prepped and draped in the usual sterile  fashion.  Timeout was performed before the beginning of the case.  Tourniquet was used for the above duration. She received 2 g of Ancef prior to making incision.  The growth on her dorsal right wrist was clearly visible and palpable.  We made a transverse incision, directly over the center of the mass.  We incised just through the dermis.  We then dissected through subcutaneous tissue to identify the superficial layers of the cyst.  We then meticulously dissected around the edges of the cyst wall, circumferentially.  We did not encounter any nerves as part of the dissection.  Extensor tendons were identified and protected.  Sequentially, we freed up the edges of the cyst wall until we had identified the stalk of the cyst.  During the dissection, the cyst was inadvertently decompressed and the contents were visible.  The cyst primarily contained thick gelatinous material, with a small amount of hemorrhagic material.  We used bipolar cautery to to cauterize small vessels surrounding the cyst.  The stalk continued deep towards the S-L interval, and it was truncated just below the level of the extensor retinaculum.  The cyst pocket measured 2.5 cm x 1.0 cm after it was decompressed.  This was not sent for pathology.  A small incision was then made in the retinaculum, and we used cautery to to cauterize the stalk of the cyst.  Hemostasis was maintained.   Instruments were removed, and the mass was no longer noticeable with the skin edges loosely approximated.   We irrigated the wound copiously. We closed the incision using 3-0 prolene with multiple interrupted sutures.  5 cc of 0.5% bupivacaine was  then injected in the skin surrounding the incision.  Sterile dressing was placed.  Patient was awoken taken to PACU in stable condition.  Post-operative plan:  The patient will be discharged home, once she has recovered in the PACU.   DVT prophylaxis not indicated in this ambulatory upper extremity patient without  significant risk factors.    Pain control with PRN pain medication preferring oral medicines.   Follow up plan will be scheduled in approximately 10-14 days for incision check.

## 2020-08-21 NOTE — Transfer of Care (Signed)
Immediate Anesthesia Transfer of Care Note  Patient: Deanna Sheppard  Procedure(s) Performed: EXCISION OF CYST FROM DORSAL RIGHT WRIST (Right: Wrist)  Patient Location: PACU  Anesthesia Type:General  Level of Consciousness: drowsy  Airway & Oxygen Therapy: Patient Spontanous Breathing and Patient connected to face mask oxygen  Post-op Assessment: Report given to RN and Post -op Vital signs reviewed and stable  Post vital signs: Reviewed and stable  Last Vitals:  Vitals Value Taken Time  BP    Temp    Pulse 58 08/21/20 0822  Resp 13 08/21/20 0822  SpO2 100 % 08/21/20 0822  Vitals shown include unvalidated device data.  Last Pain:  Vitals:   08/21/20 0645  PainSc: 5          Complications: No notable events documented.

## 2020-08-21 NOTE — Anesthesia Preprocedure Evaluation (Signed)
Anesthesia Evaluation  Patient identified by MRN, date of birth, ID band Patient awake    Reviewed: Allergy & Precautions, NPO status , Patient's Chart, lab work & pertinent test results  Airway Mallampati: II  TM Distance: >3 FB Neck ROM: Full    Dental  (+) Dental Advisory Given, Teeth Intact   Pulmonary asthma , Patient abstained from smoking., former smoker,    Pulmonary exam normal breath sounds clear to auscultation       Cardiovascular Exercise Tolerance: Good negative cardio ROS Normal cardiovascular exam Rhythm:Regular Rate:Normal     Neuro/Psych negative neurological ROS  negative psych ROS   GI/Hepatic negative GI ROS, (+)     substance abuse  alcohol use,   Endo/Other  negative endocrine ROS  Renal/GU negative Renal ROS  negative genitourinary   Musculoskeletal negative musculoskeletal ROS (+)   Abdominal   Peds negative pediatric ROS (+)  Hematology  (+) Sickle cell trait ,   Anesthesia Other Findings   Reproductive/Obstetrics negative OB ROS                            Anesthesia Physical Anesthesia Plan  ASA: 2  Anesthesia Plan: General   Post-op Pain Management:    Induction: Intravenous  PONV Risk Score and Plan: 3 and Ondansetron, Dexamethasone and Midazolam  Airway Management Planned: LMA  Additional Equipment:   Intra-op Plan:   Post-operative Plan:   Informed Consent: I have reviewed the patients History and Physical, chart, labs and discussed the procedure including the risks, benefits and alternatives for the proposed anesthesia with the patient or authorized representative who has indicated his/her understanding and acceptance.     Dental advisory given  Plan Discussed with: CRNA and Surgeon  Anesthesia Plan Comments:        Anesthesia Quick Evaluation

## 2020-08-21 NOTE — Anesthesia Procedure Notes (Signed)
Procedure Name: LMA Insertion Date/Time: 08/21/2020 7:24 AM Performed by: Orlie Dakin, CRNA Pre-anesthesia Checklist: Patient identified, Emergency Drugs available, Suction available and Patient being monitored Patient Re-evaluated:Patient Re-evaluated prior to induction Oxygen Delivery Method: Circle system utilized Preoxygenation: Pre-oxygenation with 100% oxygen Induction Type: IV induction Ventilation: Mask ventilation without difficulty LMA: LMA inserted LMA Size: 4.0 Tube type: Oral Number of attempts: 1 Placement Confirmation: positive ETCO2 Tube secured with: Tape Dental Injury: Teeth and Oropharynx as per pre-operative assessment

## 2020-08-21 NOTE — Interval H&P Note (Signed)
History and Physical Interval Note:  08/21/2020 7:12 AM  Deanna Sheppard  has presented today for surgery, with the diagnosis of Right wrist ganglion cyst.  The various methods of treatment have been discussed with the patient and family. After consideration of risks, benefits and other options for treatment, the patient has consented to  Procedure(s): EXCISION OF CYST FROM DORSAL RIGHT WRIST (Right) as a surgical intervention.  The patient's history has been reviewed, patient examined, no change in status, stable for surgery.  I have reviewed the patient's chart and labs.  Questions were answered to the patient's satisfaction.     Mordecai Rasmussen

## 2020-08-22 ENCOUNTER — Encounter (HOSPITAL_COMMUNITY): Payer: Self-pay | Admitting: Orthopedic Surgery

## 2020-09-05 ENCOUNTER — Encounter: Payer: Self-pay | Admitting: Orthopedic Surgery

## 2020-09-05 ENCOUNTER — Other Ambulatory Visit: Payer: Self-pay

## 2020-09-05 ENCOUNTER — Ambulatory Visit (INDEPENDENT_AMBULATORY_CARE_PROVIDER_SITE_OTHER): Payer: Medicaid Other | Admitting: Orthopedic Surgery

## 2020-09-05 DIAGNOSIS — M6740 Ganglion, unspecified site: Secondary | ICD-10-CM

## 2020-09-05 NOTE — Progress Notes (Signed)
Orthopaedic Postop Note  Assessment: Deanna Sheppard is a 30 y.o. female s/p excision of dorsal ganglion cyst from right wrist.   DOS: 08/21/20  Plan: Sutures were removed, and Steri-Strips were placed. Anticipate that the persistent swelling and occasional tingling sensations will continue to improve. Okay for her to return to work.  Work note provided. Recommended she continue to cover the healing incision for the next 1-2 weeks. Okay to shower, please do not scrub the incision and pat the incision dry.  Follow-up: Return in about 4 weeks (around 10/03/2020). XR at next visit: None  Subjective:  Chief Complaint  Patient presents with   Routine Post Op    DOS 08/21/20//suture removal//pt states she has a "weird" feeling where sometimes she can't feel her hand or has trouble gripping things    History of Present Illness: Deanna Sheppard is a 30 y.o. female who presents following the above stated procedure.  She is doing well.  She is not having any pain in her right wrist.  She notes that she had some numbness in her right hand immediately following surgery, but this has gradually improved.  She continues to have some swelling, which she feels is consistent with some bruising.  Otherwise, she is not having any issues.  No fevers or chills.  She has not been working since surgery.  Review of Systems: No fevers or chills No numbness or tingling No Chest Pain No shortness of breath   Objective: There were no vitals taken for this visit.  Physical Exam:  Alert and oriented.  No acute distress.  The incision on the dorsum of the right wrist is healing well.  No surrounding erythema or drainage.  She tolerates gentle range of motion of the wrist.  There is some diffuse swelling over the dorsum of the wrist and into the dorsum of the hand.  Fingers are warm and well-perfused.  Sensation is intact in the superficial radial, median and ulnar nerve distribution.  Active motion  intact in the AIN/PIN/U nerve distribution.  2+ radial pulse.  Minimal tenderness around the incision.  IMAGING: I personally ordered and reviewed the following images:  No new imaging ordered today.  Mordecai Rasmussen, MD 09/05/2020 10:55 AM

## 2020-09-05 NOTE — Patient Instructions (Signed)
Ok to return to work.  Please provide a letter.

## 2020-09-16 ENCOUNTER — Other Ambulatory Visit: Payer: Self-pay | Admitting: Family Medicine

## 2020-10-03 ENCOUNTER — Encounter: Payer: Medicaid Other | Admitting: Orthopedic Surgery

## 2020-11-11 ENCOUNTER — Ambulatory Visit: Payer: Medicaid Other | Admitting: Orthopedic Surgery

## 2020-11-17 ENCOUNTER — Encounter (HOSPITAL_COMMUNITY): Payer: Self-pay | Admitting: *Deleted

## 2020-11-17 ENCOUNTER — Emergency Department (HOSPITAL_COMMUNITY)
Admission: EM | Admit: 2020-11-17 | Discharge: 2020-11-17 | Disposition: A | Discharge status: Home / Self Care | Payer: Medicaid Other | Attending: Emergency Medicine | Admitting: Emergency Medicine

## 2020-11-17 DIAGNOSIS — J45909 Unspecified asthma, uncomplicated: Secondary | ICD-10-CM | POA: Insufficient documentation

## 2020-11-17 DIAGNOSIS — J101 Influenza due to other identified influenza virus with other respiratory manifestations: Secondary | ICD-10-CM | POA: Diagnosis not present

## 2020-11-17 DIAGNOSIS — Z87891 Personal history of nicotine dependence: Secondary | ICD-10-CM | POA: Diagnosis not present

## 2020-11-17 DIAGNOSIS — Z20822 Contact with and (suspected) exposure to covid-19: Secondary | ICD-10-CM | POA: Insufficient documentation

## 2020-11-17 DIAGNOSIS — Z79899 Other long term (current) drug therapy: Secondary | ICD-10-CM | POA: Insufficient documentation

## 2020-11-17 DIAGNOSIS — J111 Influenza due to unidentified influenza virus with other respiratory manifestations: Secondary | ICD-10-CM

## 2020-11-17 DIAGNOSIS — J029 Acute pharyngitis, unspecified: Secondary | ICD-10-CM | POA: Diagnosis present

## 2020-11-17 LAB — GROUP A STREP BY PCR: Group A Strep by PCR: NOT DETECTED

## 2020-11-17 LAB — RESP PANEL BY RT-PCR (FLU A&B, COVID) ARPGX2
Influenza A by PCR: POSITIVE — AB
Influenza B by PCR: NEGATIVE
SARS Coronavirus 2 by RT PCR: NEGATIVE

## 2020-11-17 MED ORDER — ONDANSETRON 4 MG PO TBDP: 4.0000 mg | ORAL_TABLET | Freq: Three times a day (TID) | ORAL | 0 refills | Status: DC | PRN

## 2020-11-17 MED ORDER — ALBUTEROL SULFATE HFA 108 (90 BASE) MCG/ACT IN AERS
2.0000 | INHALATION_SPRAY | Freq: Once | RESPIRATORY_TRACT | Status: AC
  Administered 2020-11-17: 2 via RESPIRATORY_TRACT
  Filled 2020-11-17: qty 6.7

## 2020-11-17 MED ORDER — OSELTAMIVIR PHOSPHATE 75 MG PO CAPS: 75.0000 mg | ORAL_CAPSULE | Freq: Two times a day (BID) | ORAL | 0 refills | Status: DC

## 2020-11-17 MED ORDER — ONDANSETRON 4 MG PO TBDP
4.0000 mg | ORAL_TABLET | Freq: Once | ORAL | Status: AC
  Administered 2020-11-17: 4 mg via ORAL
  Filled 2020-11-17: qty 1

## 2020-11-17 MED ORDER — ACETAMINOPHEN 325 MG PO TABS
650.0000 mg | ORAL_TABLET | Freq: Once | ORAL | Status: AC
  Administered 2020-11-17: 650 mg via ORAL
  Filled 2020-11-17: qty 2

## 2020-11-17 NOTE — Discharge Instructions (Signed)
You have the flu, which is a virus.  Take tamiflu to decrease the risk of needing to be admitted to the hospital.  Use zofran with this medication to decrease nausea and vomiting.  Use your inhaler every 4 hours while awake for the next 2 days. After this, use as needed.  Use Tylenol or ibuprofen as needed for fevers or body aches. Use cough medicine for cough. Make sure you stay well-hydrated with water. Wash your hands frequently to prevent spread of infection. Follow-up with your primary care doctor in 1 week if your symptoms are not improving. Return to the emergency room if you develop chest pain, difficulty breathing, or any new or worsening symptoms.

## 2020-11-17 NOTE — ED Provider Notes (Signed)
[facility] EMERGENCY DEPARTMENT Provider Note   CSN: [id number] Arrival date & time: 11/17/20  1125     History Chief Complaint  Patient presents with   Sore Throat    Deanna Sheppard is a 30 y.o. female presenting for evaluation of sore throat, cough, nausea, diarrhea.    Patient states her symptoms began yesterday.  She reports a nonproductive cough.  She has subjective fevers at home.  She reports a sore throat and ear fullness.  She has not taken anything for her symptoms.  She reports her son was sick a week ago, diagnosed with bronchitis.  No other sick contacts.  She is not vaccinated for flu.  She reports a history of asthma, no other medical problems. No cp or sob.   HPI     Past Medical History:  Diagnosis Date   Abdominal pain 11/15/2014   ASCUS with positive high risk human papillomavirus of vagina 04/16/2020   04/2020 needs colpo, immediate risk for CIN3+ is 4.4%   Asthma    Cervical high risk HPV (human papillomavirus) test positive 11/17/2015   Chlamydia    Contraception management 04/13/2012   Cough 07/23/2014   History of chlamydia 06/06/2013   Irregular intermenstrual bleeding 11/15/2014   No pertinent past medical history    Pain with urination 11/15/2014   Rib pain on left side 07/23/2014   Sickle cell trait ([place])    Trichimoniasis 10/10/2019   10/10/19 treated with flagyl   Urinary frequency 10/17/2012    Patient Active Problem List   Diagnosis Date Noted   Acute cystitis 11/21/2019   Trichimoniasis 10/10/2019   Encounter for insertion of mirena IUD 12/24/2015   Cervical high risk HPV (human papillomavirus) test positive 11/17/2015   Irregular intermenstrual bleeding 11/15/2014   Rib pain on left side 07/23/2014   Cough 07/23/2014   History of chlamydia 06/06/2013   Sickle cell trait ([place]) 04/12/2012    Past Surgical History:  Procedure Laterality Date   GANGLION CYST EXCISION Right 08/21/2020   Procedure: EXCISION OF CYST FROM DORSAL  RIGHT WRIST;  Surgeon: Mordecai Rasmussen, MD;  Location: [facility] ORS;  Service: Orthopedics;  Laterality: Right;  no specimen   NO PAST SURGERIES       OB History     Gravida  1   Para  1   Term  1   Preterm  0   AB  0   Living  1      SAB  0   IAB  0   Ectopic  0   Multiple  0   Live Births  1           Family History  Adopted: Yes  Problem Relation Age of Onset   Hypertension Mother    Cancer Maternal Grandmother        breast    Diabetes Maternal Grandmother     Social History   Tobacco Use   Smoking status: Former    Types: Cigars    Quit date: 07/23/2020    Years since quitting: 0.3   Smokeless tobacco: Never  Vaping Use   Vaping Use: Never used  Substance Use Topics   Alcohol use: Yes    Comment: once or twice a year   Drug use: Never    Home Medications Prior to Admission medications   Medication Sig Start Date End Date Taking? Authorizing Provider  ondansetron (ZOFRAN ODT) 4 MG disintegrating tablet Take 1 tablet (4 mg total) by  mouth every 8 (eight) hours as needed for nausea or vomiting. 11/17/20  Yes Letisia Schwalb, PA-C  oseltamivir (TAMIFLU) 75 MG capsule Take 1 capsule (75 mg total) by mouth every 12 (twelve) hours. 11/17/20  Yes Elasha Tess, PA-C  ipratropium (ATROVENT) 0.03 % nasal spray Place 2 sprays into both nostrils 2 (two) times daily. 06/16/20   Scot Jun, FNP  norethindrone-ethinyl estradiol (LOESTRIN FE) 1-20 MG-MCG tablet Take 1 tablet by mouth daily. 02/25/20 02/24/21  Estill Dooms, NP  promethazine-dextromethorphan (PROMETHAZINE-DM) 6.25-15 MG/5ML syrup Take 5 mLs by mouth 4 (four) times daily as needed for cough. 06/16/20   Scot Jun, FNP    Allergies    Bee venom, Azithromycin, and Levofloxacin  Review of Systems   Review of Systems  Constitutional:  Positive for fever.  HENT:  Positive for sore throat.   Respiratory:  Positive for cough.   Gastrointestinal:  Positive for diarrhea, nausea  and vomiting.  All other systems reviewed and are negative.  Physical Exam Updated Vital Signs BP 113/69 (BP Location: Right Arm)   Pulse 98   Temp 98.4 F (36.9 C) (Oral)   Resp 20   Wt 79.3 kg   SpO2 100%   BMI 30.02 kg/m   Physical Exam Vitals and nursing note reviewed.  Constitutional:      General: She is not in acute distress.    Appearance: Normal appearance.  HENT:     Head: Normocephalic and atraumatic.     Right Ear: Tympanic membrane, ear canal and external ear normal.     Left Ear: Tympanic membrane, ear canal and external ear normal.     Mouth/Throat:     Mouth: Mucous membranes are moist.     Pharynx: Posterior oropharyngeal erythema present. No oropharyngeal exudate.  Eyes:     Conjunctiva/sclera: Conjunctivae normal.     Pupils: Pupils are equal, round, and reactive to light.  Cardiovascular:     Rate and Rhythm: Normal rate and regular rhythm.     Pulses: Normal pulses.  Pulmonary:     Effort: Pulmonary effort is normal. No respiratory distress.     Breath sounds: Wheezing present.     Comments: Speaking in full sentences.  Scattered expiratory wheezing in the bases. Frequent dry cough noted on exam.  Abdominal:     General: There is no distension.     Palpations: Abdomen is soft. There is no mass.     Tenderness: There is no abdominal tenderness. There is no guarding or rebound.  Musculoskeletal:        General: Normal range of motion.     Cervical back: Normal range of motion and neck supple.  Skin:    General: Skin is warm and dry.     Capillary Refill: Capillary refill takes less than 2 seconds.  Neurological:     Mental Status: She is alert and oriented to person, place, and time.  Psychiatric:        Mood and Affect: Mood and affect normal.        Speech: Speech normal.        Behavior: Behavior normal.    ED Results / Procedures / Treatments   Labs (all labs ordered are listed, but only abnormal results are displayed) Labs Reviewed   RESP PANEL BY RT-PCR (FLU A&B, COVID) ARPGX2 - Abnormal; Notable for the following components:      Result Value   Influenza A by PCR POSITIVE (*)    All other  components within normal limits  GROUP A STREP BY PCR    EKG None  Radiology No results found.  Procedures Procedures   Medications Ordered in ED Medications  ondansetron (ZOFRAN-ODT) disintegrating tablet 4 mg (4 mg Oral Given 11/17/20 1314)  acetaminophen (TYLENOL) tablet 650 mg (650 mg Oral Given 11/17/20 1314)  albuterol (VENTOLIN HFA) 108 (90 Base) MCG/ACT inhaler 2 puff (2 puffs Inhalation Given 11/17/20 1314)    ED Course  I have reviewed the triage vital signs and the nursing notes.  Pertinent labs & imaging results that were available during my care of the patient were reviewed by me and considered in my medical decision making (see chart for details).    MDM Rules/Calculators/A&P                           Patient presenting with 1 day h/o URI symptoms.  Physical exam reassuring, patient is afebrile and appears nontoxic.  Pulmonary exam reassuring.  Doubt pneumonia, strep, other bacterial infection, or peritonsillar abscess.  Flu test positive, likely the cause of her symptoms.  Oxygen is reassuring, I do not feel she needs admission for this.  On reevaluation after medications, she reports improvement of symptoms.  Due to her history of asthma, tamiflu was offered, patient would like this.  Discussed side effects.  At this time, patient appears safe for discharge.  Return precautions given.  Patient states she understands and agrees to plan.  Final Clinical Impression(s) / ED Diagnoses Final diagnoses:  Flu  Asthma, unspecified asthma severity, unspecified whether complicated, unspecified whether persistent    Rx / DC Orders ED Discharge Orders          Ordered    oseltamivir (TAMIFLU) 75 MG capsule  Every 12 hours        11/17/20 1337    ondansetron (ZOFRAN ODT) 4 MG disintegrating tablet  Every 8  hours PRN        11/17/20 [place], Kaelan Emami, PA-C 11/17/20 1356    Varney Biles, MD 11/19/20 1249

## 2020-11-17 NOTE — ED Triage Notes (Signed)
Sore throat, cough and fever , also has diarrhea onset yesterday

## 2020-11-18 ENCOUNTER — Ambulatory Visit: Payer: Medicaid Other | Admitting: Orthopedic Surgery

## 2020-12-02 ENCOUNTER — Encounter: Payer: Self-pay | Admitting: Orthopedic Surgery

## 2020-12-02 ENCOUNTER — Ambulatory Visit: Payer: Medicaid Other | Admitting: Orthopedic Surgery

## 2021-01-30 ENCOUNTER — Other Ambulatory Visit: Payer: Self-pay

## 2021-01-30 ENCOUNTER — Ambulatory Visit (INDEPENDENT_AMBULATORY_CARE_PROVIDER_SITE_OTHER): Payer: Medicaid Other | Admitting: Orthopedic Surgery

## 2021-01-30 ENCOUNTER — Encounter: Payer: Self-pay | Admitting: Orthopedic Surgery

## 2021-01-30 VITALS — BP 122/78 | HR 87 | Ht 64.0 in | Wt 180.0 lb

## 2021-01-30 DIAGNOSIS — M67431 Ganglion, right wrist: Secondary | ICD-10-CM

## 2021-01-30 NOTE — Progress Notes (Signed)
Orthopaedic Postop Note  Assessment: Deanna Sheppard is a 31 y.o. female s/p excision of dorsal ganglion cyst from right wrist.  Now with recurrence in the same wrist.  DOS: 08/21/20  Plan: Patient has developed recurrent ganglion cyst in the same wrist, just distal to previous excision.  It appears to be larger than her previous cyst.  She states it is occasionally painful.  Discussed multiple treatment options, and she is interested in aspiration at this time.  This was completed in clinic today.  If the cyst formation persists, and she is interested in pursuing surgery once again, we will obtain an MRI, possibly a hand specialist consultation.  She states her understanding.  Follow-up as needed.  Procedure note -aspiration of cyst  Verbal consent was obtained, and the appropriate extremity was identified. The swelling on the dorsum of the right wrist was palpated, and held firm. Skin was cleaned with an alcohol swab. Ethyl chloride was used to numb the skin. An 18-gauge needle was then inserted into the midpoint of the cyst. Viscous, blood-tinged cyst contents were immediately expressed. Approximately 3-4 cc of the cyst contents were aspirated. The cyst was visibly decompressed. A sterile bandage was placed. Patient tolerated procedure well.  Follow-up: Return if symptoms worsen or fail to improve. XR at next visit: None  Subjective:  Chief Complaint  Patient presents with   Cyst    Nodule on Rt wrist for 2 months getting bigger and painful.     History of Present Illness: Deanna Sheppard is a 31 y.o. female who presents following the above stated procedure.  She recovered well, without issues following surgery.  However, approximately 2 months ago, she started to note that a cyst was reforming.  She states it has gotten progressively larger.  She has started have some pain in the right wrist.  No overlying skin changes.  She has been having some cramping sensations in  the palm of both hands.  No changes to her health otherwise.  Review of Systems: No fevers or chills No numbness or tingling No Chest Pain No shortness of breath   Objective: BP 122/78    Pulse 87    Ht 5\' 4"  (1.626 m)    Wt 180 lb (81.6 kg)    BMI 30.90 kg/m   Physical Exam:  Alert and oriented.  No acute distress.  Previous surgical incision is healed well.  No surrounding erythema or drainage.  Hyperpigmentation in this area.  No tenderness to palpation over the incision.  Just distal to this incision is an obvious growth.  It is approximately 1.5 cm x 1.5 cm.  Overlying skin is mobile.  No tenderness to palpation.  No overlying skin changes.  It is firm, but compressible.  Negative Tinel's at the carpal tunnel.  Sensations intact throughout the median nerve distribution.  No tenderness to palpation over the A1 pulley to the index, long or ring fingers.  IMAGING: I personally ordered and reviewed the following images:  No new imaging ordered today.  Mordecai Rasmussen, MD 01/30/2021 10:55 PM

## 2021-03-16 ENCOUNTER — Telehealth: Payer: Self-pay | Admitting: Orthopedic Surgery

## 2021-03-16 DIAGNOSIS — M67431 Ganglion, right wrist: Secondary | ICD-10-CM

## 2021-03-16 NOTE — Telephone Encounter (Signed)
Patient called and is requesting a MRI to be done on her right wrist.  ? ?Please call her back at [phone] ?

## 2021-04-06 ENCOUNTER — Ambulatory Visit (HOSPITAL_COMMUNITY)
Admission: RE | Admit: 2021-04-06 | Discharge: 2021-04-06 | Disposition: A | Discharge status: Home / Self Care | Payer: Medicaid Other | Source: Ambulatory Visit | Attending: Orthopedic Surgery | Admitting: Orthopedic Surgery

## 2021-04-06 DIAGNOSIS — M67431 Ganglion, right wrist: Secondary | ICD-10-CM | POA: Diagnosis present

## 2021-04-08 ENCOUNTER — Ambulatory Visit (INDEPENDENT_AMBULATORY_CARE_PROVIDER_SITE_OTHER): Payer: Medicaid Other | Admitting: Orthopedic Surgery

## 2021-04-08 ENCOUNTER — Encounter: Payer: Self-pay | Admitting: Orthopedic Surgery

## 2021-04-08 VITALS — Ht 64.0 in | Wt 180.0 lb

## 2021-04-08 DIAGNOSIS — M67431 Ganglion, right wrist: Secondary | ICD-10-CM

## 2021-04-08 DIAGNOSIS — Z9889 Other specified postprocedural states: Secondary | ICD-10-CM

## 2021-04-08 NOTE — Patient Instructions (Signed)
Will contact you to finalize plan for another procedure.  ?

## 2021-04-09 NOTE — Progress Notes (Signed)
Orthopaedic Postop Note ? ?Assessment: ?Deanna Sheppard is a 31 y.o. female s/p excision of dorsal ganglion cyst from right wrist.  Now with recurrence in the same wrist. ? ?DOS: 08/21/20 ? ?Plan: ?We reviewed the results of the MRI in clinic today.  She has a recurrence of a dorsal ganglion cyst, without obvious stalk of origin.  This was relayed to the patient.  She is interested in pursuing surgery, she would like to have the growth removed.  I offered multiple options, and she is comfortable with me discussing the plan with a hand specialist to ensure that we limit the possibility of recurrence once again.  I will plan to discuss this with my colleagues, and contact the patient directly to discuss the next steps.  She is interested in surgery.  In the meantime, no restrictions.  If she has not heard from me within a couple weeks, have asked her to contact the clinic. ? ?Follow-up: ?Return if symptoms worsen or fail to improve. ?XR at next visit: None ? ?Subjective: ? ?Chief Complaint  ?Patient presents with  ? Wrist Pain  ?  Cyst on wrist MRI results   ? ? ?History of Present Illness: ?Deanna Sheppard is a 31 y.o. female who presents following the above stated procedure.  She recovered well, without issues following surgery.  However, the cyst has returned.  It is now larger.  It is more distal than it was previously.  Occasionally, she notes some discomfort with increased activity.  No overlying redness.  No concerns for infection.  She is interested in pursuing surgery once again, if this can alleviate the growth. ? ? ?Review of Systems: ?No fevers or chills ?No numbness or tingling ?No Chest Pain ?No shortness of breath ? ? ?Objective: ?Ht '5\' 4"'$  (1.626 m)   Wt 180 lb (81.6 kg)   BMI 30.90 kg/m?  ? ?Physical Exam: ? ?Alert and oriented.  No acute distress. ? ?Previous surgical incision is healed well.  No surrounding erythema or drainage.  Hyperpigmentation in this area.  No tenderness to palpation  over the incision.  Just distal to this incision is an obvious growth.  It is approximately 1.5 cm x 1.5 cm.  Overlying skin is mobile.  No tenderness to palpation.  No overlying skin changes.  It is firm, but compressible.  Negative Tinel's at the carpal tunnel.  Sensations intact throughout the median nerve distribution.  No tenderness to palpation over the A1 pulley to the index, long or ring fingers. ? ?IMAGING: ?I personally ordered and reviewed the following images: ? ?Right wrist MRI: ? ?Impression: ? ?Cystic lesion in the dorsal soft tissues of the wrist is most ?consistent with a ganglion. As noted above, there is some fluid in ?the compartment 2 extensor tendons sheaths and the extensor carpi ?radialis brevis appears to pass through the periphery of the ?collection worrisome for communication with the tendon sheaths. ? ?Mordecai Rasmussen, MD ?04/09/2021 ?10:52 PM ? ? ?

## 2021-05-11 ENCOUNTER — Other Ambulatory Visit: Payer: Self-pay | Admitting: Adult Health

## 2021-06-10 ENCOUNTER — Other Ambulatory Visit: Payer: Self-pay | Admitting: Family Medicine

## 2021-06-12 ENCOUNTER — Encounter: Payer: Self-pay | Admitting: Orthopedic Surgery

## 2021-06-12 ENCOUNTER — Ambulatory Visit: Payer: Medicaid Other | Admitting: Orthopedic Surgery

## 2021-06-12 VITALS — Ht 64.0 in | Wt 180.0 lb

## 2021-06-12 DIAGNOSIS — M67431 Ganglion, right wrist: Secondary | ICD-10-CM | POA: Diagnosis not present

## 2021-06-12 NOTE — Progress Notes (Signed)
Orthopaedic Postop Note  Assessment: Deanna Sheppard is a 31 y.o. female s/p excision of dorsal ganglion cyst from right wrist.  Now with recurrence in the same wrist.  DOS: 08/21/20  Plan: Radonna Ricker recurrence of the dorsal ganglion cyst.  MRI is without obvious origin.  She is not interested in considering surgery at this time, because there is a chance that this could recur.  She is interested in aspiration.  This was completed in clinic today.  Procedure note -aspiration of cyst  Verbal consent was obtained, and the appropriate extremity was identified. The swelling on the dorsum of the right wrist was palpated, and held firm. Skin was cleaned with an alcohol swab. Ethyl chloride was used to numb the skin. An 18-gauge needle was then inserted into the midpoint of the cyst. cyst contents were immediately expressed. Approximately 3-4 cc of the cyst contents were aspirated. The cyst was visibly decompressed. A sterile bandage was placed. Patient tolerated procedure well.  Follow-up: No follow-ups on file. XR at next visit: None  Subjective:  Chief Complaint  Patient presents with   Wrist Pain    Rt wrist cyst back.     History of Present Illness: Deanna Sheppard is a 30 y.o. female who returns for evaluation of the cyst on her left wrist.  It continues to slowly get bigger.  Because of her job responsibilities, using the wrist puts pressure on the cyst.  She is not interested in surgery.  I have discussed with her that without an obvious stalk, it is difficult to assess the relative efficacy of surgery.  She states her understanding.  She has a brace at home, which she will wear occasionally.   Review of Systems: No fevers or chills No numbness or tingling No Chest Pain No shortness of breath   Objective: Ht '5\' 4"'$  (1.626 m)   Wt 180 lb (81.6 kg)   BMI 30.90 kg/m   Physical Exam:  Alert and oriented.  No acute distress.  Previous surgical incision is  healed well.  No surrounding erythema or drainage.  Hyperpigmentation in this area.  No tenderness to palpation over the incision.  Just distal to this incision is an obvious growth.  It is approximately 1.5 cm x 1.5 cm.  Overlying skin is mobile.  No tenderness to palpation.  No overlying skin changes.  It is firm, but compressible.  Negative Tinel's at the carpal tunnel.  Sensations intact throughout the median nerve distribution.  No tenderness to palpation over the A1 pulley to the index, long or ring fingers.  IMAGING: I personally ordered and reviewed the following images:  No new imaging ordered today.  Deanna Rasmussen, MD 06/12/2021 3:04 PM

## 2021-07-13 ENCOUNTER — Encounter (HOSPITAL_COMMUNITY): Payer: Self-pay

## 2021-07-13 ENCOUNTER — Emergency Department (HOSPITAL_COMMUNITY): Payer: Self-pay

## 2021-07-13 ENCOUNTER — Emergency Department (HOSPITAL_COMMUNITY)
Admission: EM | Admit: 2021-07-13 | Discharge: 2021-07-13 | Disposition: A | Discharge status: Home / Self Care | Payer: Self-pay | Attending: Student | Admitting: Student

## 2021-07-13 ENCOUNTER — Other Ambulatory Visit: Payer: Self-pay

## 2021-07-13 DIAGNOSIS — Y92410 Unspecified street and highway as the place of occurrence of the external cause: Secondary | ICD-10-CM | POA: Insufficient documentation

## 2021-07-13 DIAGNOSIS — Z23 Encounter for immunization: Secondary | ICD-10-CM | POA: Insufficient documentation

## 2021-07-13 DIAGNOSIS — S80211A Abrasion, right knee, initial encounter: Secondary | ICD-10-CM | POA: Insufficient documentation

## 2021-07-13 DIAGNOSIS — T148XXA Other injury of unspecified body region, initial encounter: Secondary | ICD-10-CM

## 2021-07-13 DIAGNOSIS — S60221A Contusion of right hand, initial encounter: Secondary | ICD-10-CM

## 2021-07-13 DIAGNOSIS — D3610 Benign neoplasm of peripheral nerves and autonomic nervous system, unspecified: Secondary | ICD-10-CM | POA: Insufficient documentation

## 2021-07-13 DIAGNOSIS — S069XAA Unspecified intracranial injury with loss of consciousness status unknown, initial encounter: Secondary | ICD-10-CM | POA: Insufficient documentation

## 2021-07-13 DIAGNOSIS — S80212A Abrasion, left knee, initial encounter: Secondary | ICD-10-CM | POA: Insufficient documentation

## 2021-07-13 DIAGNOSIS — J45909 Unspecified asthma, uncomplicated: Secondary | ICD-10-CM | POA: Insufficient documentation

## 2021-07-13 DIAGNOSIS — R519 Headache, unspecified: Secondary | ICD-10-CM | POA: Insufficient documentation

## 2021-07-13 DIAGNOSIS — Z87891 Personal history of nicotine dependence: Secondary | ICD-10-CM | POA: Insufficient documentation

## 2021-07-13 MED ORDER — ACETAMINOPHEN 500 MG PO TABS
1000.0000 mg | ORAL_TABLET | Freq: Once | ORAL | Status: AC
  Administered 2021-07-13: 1000 mg via ORAL
  Filled 2021-07-13: qty 2

## 2021-07-13 MED ORDER — TETANUS-DIPHTH-ACELL PERTUSSIS 5-2.5-18.5 LF-MCG/0.5 IM SUSY
0.5000 mL | PREFILLED_SYRINGE | Freq: Once | INTRAMUSCULAR | Status: AC
  Administered 2021-07-13: 0.5 mL via INTRAMUSCULAR
  Filled 2021-07-13: qty 0.5
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
## 2021-07-13 NOTE — ED Notes (Signed)
Xerofoam dressing applied to both knees
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
## 2021-07-28 ENCOUNTER — Ambulatory Visit: Payer: Self-pay | Admitting: Adult Health

## 2021-09-25 ENCOUNTER — Ambulatory Visit (INDEPENDENT_AMBULATORY_CARE_PROVIDER_SITE_OTHER): Payer: Medicaid Other | Admitting: Adult Health

## 2021-09-25 ENCOUNTER — Encounter: Payer: Self-pay | Admitting: Adult Health

## 2021-09-25 ENCOUNTER — Other Ambulatory Visit (HOSPITAL_COMMUNITY)
Admission: RE | Admit: 2021-09-25 | Discharge: 2021-09-25 | Disposition: A | Discharge status: Home / Self Care | Payer: Medicaid Other | Source: Ambulatory Visit | Attending: Adult Health | Admitting: Adult Health

## 2021-09-25 VITALS — BP 104/70 | HR 80 | Ht 64.0 in | Wt 177.2 lb

## 2021-09-25 DIAGNOSIS — Z8742 Personal history of other diseases of the female genital tract: Secondary | ICD-10-CM

## 2021-09-25 DIAGNOSIS — Z87891 Personal history of nicotine dependence: Secondary | ICD-10-CM

## 2021-09-25 DIAGNOSIS — Z3041 Encounter for surveillance of contraceptive pills: Secondary | ICD-10-CM | POA: Diagnosis not present

## 2021-09-25 DIAGNOSIS — Z124 Encounter for screening for malignant neoplasm of cervix: Secondary | ICD-10-CM | POA: Insufficient documentation

## 2021-09-25 DIAGNOSIS — Z113 Encounter for screening for infections with a predominantly sexual mode of transmission: Secondary | ICD-10-CM

## 2021-09-25 DIAGNOSIS — R87620 Atypical squamous cells of undetermined significance on cytologic smear of vagina (ASC-US): Secondary | ICD-10-CM | POA: Insufficient documentation

## 2021-09-25 DIAGNOSIS — Z01419 Encounter for gynecological examination (general) (routine) without abnormal findings: Secondary | ICD-10-CM | POA: Diagnosis not present

## 2021-09-25 MED ORDER — NORETHIN ACE-ETH ESTRAD-FE 1-20 MG-MCG PO TABS: 1.0000 | ORAL_TABLET | Freq: Every day | ORAL | 3 refills | Status: DC

## 2021-09-25 NOTE — Progress Notes (Signed)
Patient ID: Deanna Sheppard, female   DOB: 21-Nov-1990, 31 y.o.   MRN: [id number] History of Present Illness: Deanna Sheppard is a 31 year old black female,singel, G1P1 in for STD screening and refill birth control, but she needs well woman exam and pap. She has [employer].  Last pap was 04/09/20 +HPV, +ASCUS. Colpo was 06/13/21 CIN 1  PCP is R Muse PA.    Current Medications, Allergies, Past Medical History, Past Surgical History, Family History and Social History were reviewed in [employer] record.     Review of Systems: Patient denies any headaches, hearing loss, fatigue, blurred vision, shortness of breath, chest pain, abdominal pain, problems with bowel movements, urination, or intercourse. No joint pain or mood swings.  + low back pain, esp if stand long time.   Physical Exam:BP 104/70 (BP Location: Right Arm, Patient Position: Sitting, Cuff Size: Normal)   Pulse 80   Ht '5\' 4"'$  (1.626 m)   Wt 177 lb 4 oz (80.4 kg)   LMP 09/04/2021 (Approximate)   BMI 30.42 kg/m   General:  Well developed, well nourished, no acute distress Skin:  Warm and dry Neck:  Midline trachea, normal thyroid, good ROM, no lymphadenopathy Lungs; Clear to auscultation bilaterally Breast:  No dominant palpable mass, retraction, or nipple discharge Cardiovascular: Regular rate and rhythm Abdomen:  Soft, non tender, no hepatosplenomegaly Pelvic:  External genitalia is normal in appearance, no lesions.  The vagina is normal in appearance. Urethra has no lesions or masses. The cervix is bulbous. Pap with GC/CHL and HR HPV genotyping performed.  Uterus is felt to be normal size, shape, and contour.  No adnexal masses or tenderness noted.Bladder is non tender, no masses felt. Extremities/musculoskeletal:  No swelling or varicosities noted, no clubbing or cyanosis Psych:  No mood changes, alert and cooperative,seems happy Fall risk is low  Upstream - 09/25/21 1259       Pregnancy  Intention Screening   Does the patient want to become pregnant in the next year? No    Does the patient's partner want to become pregnant in the next year? No    Would the patient like to discuss contraceptive options today? No      Contraception Wrap Up   Current Method Oral Contraceptive    End Method Oral Contraceptive            Examination chaperoned by Andrez Grime RN   Impression and Plan: 1. Routine cervical smear Pap sent  - Cytology - PAP( [facility])  2. Encounter for gynecological examination with Papanicolaou smear of cervix Pap sent  Pap in 3 years if normal Physical in 1 year  3. Screening examination for STD (sexually transmitted disease) Check labs GC/CHL was sent on pap - Hepatitis C antibody - Hepatitis B surface antigen - HIV Antibody (routine testing w rflx) - RPR  4. ASCUS with positive high risk human papillomavirus of vagina Pap sent   5. Encounter for surveillance of contraceptive pills She is happy with Blisovi, will refill Meds ordered this encounter  Medications   norethindrone-ethinyl estradiol-FE (BLISOVI FE 1/20) 1-20 MG-MCG tablet    Sig: Take 1 tablet by mouth daily.    Dispense:  84 tablet    Refill:  3    Order Specific Question:   Supervising Provider    Answer:   Tania Ade H [[id number]]

## 2021-09-30 LAB — CYTOLOGY - PAP
Chlamydia: NEGATIVE
Comment: NEGATIVE
Comment: NEGATIVE
Comment: NEGATIVE
Comment: NEGATIVE
Comment: NORMAL
Diagnosis: NEGATIVE
Diagnosis: REACTIVE
HPV 16: NEGATIVE
HPV 18 / 45: NEGATIVE
High risk HPV: POSITIVE — AB
Neisseria Gonorrhea: NEGATIVE

## 2021-12-15 ENCOUNTER — Ambulatory Visit: Payer: Medicaid Other | Admitting: Orthopedic Surgery

## 2022-03-04 ENCOUNTER — Encounter: Payer: Self-pay | Admitting: Radiology

## 2022-03-10 ENCOUNTER — Ambulatory Visit (INDEPENDENT_AMBULATORY_CARE_PROVIDER_SITE_OTHER): Payer: Medicaid Other | Admitting: Adult Health

## 2022-03-10 ENCOUNTER — Encounter: Payer: Self-pay | Admitting: Adult Health

## 2022-03-10 VITALS — BP 106/73 | HR 59 | Ht 64.0 in | Wt 180.0 lb

## 2022-03-10 DIAGNOSIS — M5440 Lumbago with sciatica, unspecified side: Secondary | ICD-10-CM

## 2022-03-10 DIAGNOSIS — M62830 Muscle spasm of back: Secondary | ICD-10-CM | POA: Diagnosis not present

## 2022-03-10 NOTE — Progress Notes (Signed)
  Subjective:     Patient ID: Deanna Sheppard, female   DOB: August 13, 1990, 32 y.o.   MRN: [id number]  HPI Deanna Sheppard is a 32 year old black female,single, G1P1001, in complaining of low back pain,that radiates down leg then back up side near breast, may be left or right, but not both at same time and it comes and goes. Feels like squeezing pain. She was seen at Urgent Care 03/05/22 and prescribed flexeril 10 mg and naproxen prn and doxycycline. 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 No pain today. But has had for about a month.  Last pap was negative for malignancy and +HPV, 09/25/21, repeat in 1 year.  PCP is [employer] PA.   Review of Systems +low back pain,that radiates down leg then back up side near breast, may be left or right, but not both at same time and it comes and goes. Feels like squeezing pain.   No know injury, does lift clients Reviewed past medical,surgical, social and family history. Reviewed medications and allergies.  Objective:   Physical Exam BP 106/73 (BP Location: Left Arm, Patient Position: Sitting, Cuff Size: Normal)   Pulse (!) 59   Ht '5\' 4"'$  (1.626 m)   Wt 180 lb (81.6 kg)   LMP 03/05/2022 (Approximate)   BMI 30.90 kg/m  Skin warm and dry. Lungs: clear to ausculation bilaterally. Cardiovascular: regular rate and rhythm. No pain low back or with movement Fall risk is low  Upstream - 03/10/22 1509       Pregnancy Intention Screening   Does the patient want to become pregnant in the next year? No    Does the patient's partner want to become pregnant in the next year? No    Would the patient like to discuss contraceptive options today? No      Contraception Wrap Up   Current Method Oral Contraceptive    End Method Oral Contraceptive                Assessment:     1. Muscle spasm of back +spasms Continue flexeril, but not helping much Continue naproxen which does help Try alternating heat and ice Try pantie girdle or back band for  support  2. Acute low back pain with sciatica, sciatica laterality unspecified, unspecified back pain laterality Has had pains for about a month now     Plan:    If pain persists will get xrays  Follow up 03/29/22

## 2022-03-29 ENCOUNTER — Ambulatory Visit: Payer: Medicaid Other | Admitting: Adult Health

## 2022-04-14 ENCOUNTER — Encounter: Payer: Self-pay | Admitting: Orthopedic Surgery

## 2022-04-14 ENCOUNTER — Ambulatory Visit (INDEPENDENT_AMBULATORY_CARE_PROVIDER_SITE_OTHER): Payer: Medicaid Other | Admitting: Orthopedic Surgery

## 2022-04-14 DIAGNOSIS — M67431 Ganglion, right wrist: Secondary | ICD-10-CM | POA: Diagnosis not present

## 2022-04-14 NOTE — Patient Instructions (Signed)
Wrist brace at night  Consider a referral to a hand specialist if still having issues

## 2022-04-16 NOTE — Progress Notes (Signed)
Orthopaedic Postop Note  Assessment: Deanna Sheppard is a 32 y.o. female s/p excision of dorsal ganglion cyst from right wrist.  Now with recurrence in the same wrist.  DOS: 08/21/20  Plan: Derrie has recurrence of the dorsal ganglion cyst.  She has had surgery.  She has had this drained.  MRI is without obvious origin.  She feels that the cyst is more complicated at this point, and this concerned about how it is affecting her grip and abilities at work.  We discussed multiple treatment options.  She can consider evaluation by hand specialist, or try wearing a brace to help with her stability and strength.  She would like to try the brace for now.  She will follow-up as needed.  Follow-up: Return if symptoms worsen or fail to improve. XR at next visit: None  Subjective:  Chief Complaint  Patient presents with   Wrist Pain    R wrist cyst had come back, states she feels like there are more cyst in the wrist now.     History of Present Illness: ABBRIELLA Sheppard is a 32 y.o. female who returns for evaluation of the cyst on her right wrist.  He continues to bother her.  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  She feels as though the cyst is getting bigger, and more complicated.  No overlying skin changes.  No drainage.  She is not using a brace at this time.   Review of Systems: No fevers or chills No numbness or tingling No Chest Pain No shortness of breath   Objective: There were no vitals taken for this visit.  Physical Exam:  Alert and oriented.  No acute distress.  Surgical incision has healed.  There is a mass which is palpable and visible.  This becomes more prominent with flexion of the wrist.  There does appear to be some septations, which are palpable.  She has good grip strength.  No tenderness to palpation throughout the right hand.  No numbness or tingling.  2+ radial pulse.  IMAGING: I personally ordered and  reviewed the following images:  No new imaging ordered today.  Oliver Barre, MD 04/16/2022 8:27 AM

## 2022-05-18 ENCOUNTER — Other Ambulatory Visit: Payer: Self-pay

## 2022-05-18 ENCOUNTER — Emergency Department (HOSPITAL_COMMUNITY)
Admission: EM | Admit: 2022-05-18 | Discharge: 2022-05-18 | Disposition: A | Discharge status: Home / Self Care | Payer: Medicaid Other | Attending: Emergency Medicine | Admitting: Emergency Medicine

## 2022-05-18 ENCOUNTER — Emergency Department (HOSPITAL_COMMUNITY): Payer: Medicaid Other

## 2022-05-18 DIAGNOSIS — B9789 Other viral agents as the cause of diseases classified elsewhere: Secondary | ICD-10-CM | POA: Diagnosis not present

## 2022-05-18 DIAGNOSIS — J069 Acute upper respiratory infection, unspecified: Secondary | ICD-10-CM

## 2022-05-18 DIAGNOSIS — Z20822 Contact with and (suspected) exposure to covid-19: Secondary | ICD-10-CM | POA: Insufficient documentation

## 2022-05-18 DIAGNOSIS — R519 Headache, unspecified: Secondary | ICD-10-CM | POA: Diagnosis present

## 2022-05-18 LAB — SARS CORONAVIRUS 2 BY RT PCR: SARS Coronavirus 2 by RT PCR: NEGATIVE

## 2022-05-18 LAB — POC URINE PREG, ED: Preg Test, Ur: NEGATIVE

## 2022-05-18 MED ORDER — ACETAMINOPHEN 325 MG PO TABS
650.0000 mg | ORAL_TABLET | Freq: Once | ORAL | Status: AC
  Administered 2022-05-18: 650 mg via ORAL
  Filled 2022-05-18: qty 2

## 2022-05-18 NOTE — ED Provider Notes (Signed)
[place] EMERGENCY DEPARTMENT AT [facility] Provider Note   CSN: [id number] Arrival date & time: 05/18/22  1923     History  Chief Complaint  Patient presents with   Headache   Cough    Deanna Sheppard is a 32 y.o. female.  Pt complains of a fever, sore throat, cough and congestion.  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   The history is provided by the patient. No language interpreter was used.  Headache Pain location:  Generalized Radiates to:  Does not radiate Timing:  Constant Progression:  Worsening Chronicity:  New Relieved by:  Nothing Associated symptoms: cough   Cough Associated symptoms: headaches        Home Medications Prior to Admission medications   Medication Sig Start Date End Date Taking? Authorizing Provider  cyclobenzaprine (FLEXERIL) 10 MG tablet Take 10 mg by mouth 3 (three) times daily as needed. 03/05/22   [provider]  doxycycline (VIBRA-TABS) 100 MG tablet Take 100 mg by mouth 2 (two) times daily. 03/05/22   [provider]  naproxen (NAPROSYN) 500 MG tablet Take 500 mg by mouth 2 (two) times daily. 03/05/22   [provider]  norethindrone-ethinyl estradiol-FE (BLISOVI FE 1/20) 1-20 MG-MCG tablet Take 1 tablet by mouth daily. 09/25/21   Adline Potter, NP      Allergies    Bee venom, Azithromycin, and Levofloxacin    Review of Systems   Review of Systems  Respiratory:  Positive for cough.   Neurological:  Positive for headaches.  All other systems reviewed and are negative.   Physical Exam Updated Vital Signs BP 118/85   Pulse 86   Temp 98.6 F (37 C) (Oral)   Resp 20   SpO2 100%  Physical Exam Vitals and nursing note reviewed.  Constitutional:      Appearance: She is well-developed.  HENT:     Head: Normocephalic.  Cardiovascular:     Rate and Rhythm: Normal rate.  Pulmonary:     Effort: Pulmonary effort is normal.   Abdominal:     General: There is no distension.  Musculoskeletal:        General: Normal range of motion.     Cervical back: Normal range of motion.  Skin:    General: Skin is warm.  Neurological:     General: No focal deficit present.     Mental Status: She is alert and oriented to person, place, and time.     ED Results / Procedures / Treatments   Labs (all labs ordered are listed, but only abnormal results are displayed) Labs Reviewed  SARS CORONAVIRUS 2 BY RT PCR  POC URINE PREG, ED    EKG None  Radiology DG Chest 2 View  Result Date: 05/18/2022 CLINICAL DATA:  Chest pain EXAM: CHEST - 2 VIEW COMPARISON:  X-ray 04/20/2020 and CT angiogram. Older exams as well. FINDINGS: No consolidation, pneumothorax or effusion. No edema. Normal cardiopericardial silhouette. IMPRESSION: No acute cardiopulmonary disease. Electronically Signed   By: Karen Kays M.D.   On: 05/18/2022 20:16    Procedures Procedures    Medications Ordered in ED Medications - No data to display  ED Course/ Medical Decision Making/ A&P                             Medical Decision Making Pt complains of a fever cough  and congestion   Amount and/or Complexity of Data Reviewed Labs: ordered. Decision-making details documented in ED Course.    Details: Labs ordered reviewed and interpreted.  Covid is negative, urine pregancy is negative Radiology: ordered and independent interpretation performed. Decision-making details documented in ED Course.    Details: Chest xray  no acute            Final Clinical Impression(s) / ED Diagnoses Final diagnoses:  Viral upper respiratory tract infection    Rx / DC Orders ED Discharge Orders     None      An After Visit Summary was printed and given to the patient.    Osie Cheeks 05/18/22 2140    Cathren Laine, MD 05/19/22 [phone]

## 2022-05-18 NOTE — ED Triage Notes (Signed)
Pt with chest pain, mild cough, body aches, and sneezing with congestion since yesterday. Works at a nursing facility.  Has had chills.

## 2022-10-18 ENCOUNTER — Ambulatory Visit (INDEPENDENT_AMBULATORY_CARE_PROVIDER_SITE_OTHER): Payer: Medicaid Other | Admitting: Adult Health

## 2022-10-18 ENCOUNTER — Encounter: Payer: Self-pay | Admitting: Adult Health

## 2022-10-18 ENCOUNTER — Other Ambulatory Visit (HOSPITAL_COMMUNITY)
Admission: RE | Admit: 2022-10-18 | Discharge: 2022-10-18 | Disposition: A | Discharge status: Home / Self Care | Payer: Medicaid Other | Source: Ambulatory Visit | Attending: Adult Health | Admitting: Adult Health

## 2022-10-18 VITALS — BP 117/76 | HR 72 | Ht 64.0 in | Wt 178.0 lb

## 2022-10-18 DIAGNOSIS — Z3202 Encounter for pregnancy test, result negative: Secondary | ICD-10-CM | POA: Diagnosis not present

## 2022-10-18 DIAGNOSIS — Z1339 Encounter for screening examination for other mental health and behavioral disorders: Secondary | ICD-10-CM | POA: Diagnosis not present

## 2022-10-18 DIAGNOSIS — R87810 Cervical high risk human papillomavirus (HPV) DNA test positive: Secondary | ICD-10-CM | POA: Diagnosis not present

## 2022-10-18 DIAGNOSIS — Z01419 Encounter for gynecological examination (general) (routine) without abnormal findings: Secondary | ICD-10-CM

## 2022-10-18 DIAGNOSIS — R4589 Other symptoms and signs involving emotional state: Secondary | ICD-10-CM | POA: Insufficient documentation

## 2022-10-18 DIAGNOSIS — R519 Headache, unspecified: Secondary | ICD-10-CM

## 2022-10-18 DIAGNOSIS — G479 Sleep disorder, unspecified: Secondary | ICD-10-CM | POA: Insufficient documentation

## 2022-10-18 DIAGNOSIS — R232 Flushing: Secondary | ICD-10-CM

## 2022-10-18 DIAGNOSIS — N926 Irregular menstruation, unspecified: Secondary | ICD-10-CM

## 2022-10-18 LAB — POCT URINE PREGNANCY: Preg Test, Ur: NEGATIVE

## 2022-10-18 MED ORDER — HYDROXYZINE PAMOATE 25 MG PO CAPS: ORAL_CAPSULE | ORAL | 1 refills | Status: DC

## 2022-10-18 NOTE — Progress Notes (Signed)
Patient ID: DEJANEE THIBEAUX, female   DOB: 1990-05-27, 32 y.o.   MRN: [id number] History of Present Illness: Deanna Sheppard is a 32 year old black female, with SO, G1P1001, in for well woman gyn exam and pap. Pap last year +HR HPV. She is having hot flashes on and off her period and periods are irregular. Is angry and teary at times, and stressed. Has clots with periods. Not sleeping well  PCP is R Muse PA    Current Medications, Allergies, Past Medical History, Past Surgical History, Family History and Social History were reviewed in [employer] record.     Review of Systems: Patient denies any hearing loss, fatigue, blurred vision, shortness of breath, chest pain, abdominal pain, problems with bowel movements, urination, or intercourse. No joint pain or mood swings. Had headache over right eye recently  See HPI for positives.   Physical Exam:BP 117/76 (BP Location: Left Arm, Patient Position: Sitting, Cuff Size: Normal)   Pulse 72   Ht 5\' 4"  (1.626 m)   Wt 178 lb (80.7 kg)   LMP 10/14/2022   BMI 30.55 kg/m  UPT is negative. General:  Well developed, well nourished, no acute distress Skin:  Warm and dry Neck:  Midline trachea, normal thyroid, good ROM, no lymphadenopathy Lungs; Clear to auscultation bilaterally Breast:  No dominant palpable mass, retraction, or nipple discharge Cardiovascular: Regular rate and rhythm Abdomen:  Soft, non tender, no hepatosplenomegaly Pelvic:  External genitalia is normal in appearance, no lesions.  The vagina is normal in appearance. Urethra has no lesions or masses. The cervix is smooth, pap with GC/CHL and  HR HPV genotyping performed.   Uterus is felt to be normal size, shape, and contour.  No adnexal masses or tenderness noted.Bladder is non tender, no masses felt. Extremities/musculoskeletal:  No swelling or varicosities noted, no clubbing or cyanosis Psych:  No mood changes, alert and cooperative,seems happy AA is 2     10/18/2022    3:37 PM 04/09/2020   11:34 AM 09/07/2016    3:07 PM  Depression screen PHQ 2/9  Decreased Interest 1 1 2   Down, Depressed, Hopeless 1 1 0  PHQ - 2 Score 2 2 2   Altered sleeping 3 1 3   Tired, decreased energy 1 1 3   Change in appetite 1 0 2  Feeling bad or failure about yourself  0 0 1  Trouble concentrating 0 0 0  Moving slowly or fidgety/restless 0 0 2  Suicidal thoughts 0 0 0  PHQ-9 Score 7 4 13        10/18/2022    3:38 PM 04/09/2020   11:35 AM  GAD 7 : Generalized Anxiety Score  Nervous, Anxious, on Edge 0 0  Control/stop worrying 1 0  Worry too much - different things 1 1  Trouble relaxing 1 1  Restless 0 0  Easily annoyed or irritable 0 0  Afraid - awful might happen 0 0  Total GAD 7 Score 3 2      Upstream - 10/18/22 1543       Pregnancy Intention Screening   Does the patient want to become pregnant in the next year? Yes    Does the patient's partner want to become pregnant in the next year? Yes    Would the patient like to discuss contraceptive options today? No      Contraception Wrap Up   Current Method Pregnant/Seeking Pregnancy    End Method Pregnant/Seeking Pregnancy  Examination chaperoned by Malachy Mood LPN  Impression and Plan: 1. Pregnancy examination or test, negative result - POCT urine pregnancy  2. Encounter for gynecological examination with Papanicolaou smear of cervix Pap sent Pap in 3 years if normal Physical in 1 year - Cytology - PAP( [place])  3. Cervical high risk HPV (human papillomavirus) test positive Pap sent   4. Irregular periods Will check labs - TSH + free T4 - CBC  5. Moody +stressed, works 2 jobs  - TSH + free T4  6. Frequent headaches - TSH + free T4 - CBC - Comprehensive metabolic panel  7. Hot flashes +hot flashes on and off period  8. Sleep disturbance Not sleeping well, will try vistaril 25 mg 1 at HS  Meds ordered this encounter  Medications   hydrOXYzine (VISTARIL)  25 MG capsule    Sig: Take 1 at bedtime    Dispense:  30 capsule    Refill:  1    Order Specific Question:   Supervising Provider    Answer:   Lazaro Arms [[id number]]      Will talk when labs back

## 2022-10-19 LAB — CBC
Hematocrit: 41 % (ref 34.0–46.6)
Hemoglobin: 13.6 g/dL (ref 11.1–15.9)
MCH: 28.8 pg (ref 26.6–33.0)
MCHC: 33.2 g/dL (ref 31.5–35.7)
MCV: 87 fL (ref 79–97)
Platelets: 354 10*3/uL (ref 150–450)
RBC: 4.72 x10E6/uL (ref 3.77–5.28)
RDW: 12.6 % (ref 11.7–15.4)
WBC: 8.4 10*3/uL (ref 3.4–10.8)

## 2022-10-19 LAB — TSH+FREE T4
Free T4: 1.41 ng/dL (ref 0.82–1.77)
TSH: 0.331 u[IU]/mL — ABNORMAL LOW (ref 0.450–4.500)

## 2022-10-19 LAB — COMPREHENSIVE METABOLIC PANEL
ALT: 20 [IU]/L (ref 0–32)
AST: 22 [IU]/L (ref 0–40)
Albumin: 4.5 g/dL (ref 4.0–5.0)
Alkaline Phosphatase: 65 [IU]/L (ref 44–121)
BUN/Creatinine Ratio: 12 (ref 9–23)
BUN: 8 mg/dL (ref 6–20)
Bilirubin Total: 0.3 mg/dL (ref 0.0–1.2)
CO2: 24 mmol/L (ref 20–29)
Calcium: 9.4 mg/dL (ref 8.7–10.2)
Chloride: 101 mmol/L (ref 96–106)
Creatinine, Ser: 0.67 mg/dL (ref 0.57–1.00)
Globulin, Total: 2.6 g/dL (ref 1.5–4.5)
Glucose: 93 mg/dL (ref 70–99)
Potassium: 3.6 mmol/L (ref 3.5–5.2)
Sodium: 140 mmol/L (ref 134–144)
Total Protein: 7.1 g/dL (ref 6.0–8.5)
eGFR: 121 mL/min/{1.73_m2} (ref >59)

## 2022-10-21 LAB — CYTOLOGY - PAP
Adequacy: ABSENT
Chlamydia: NEGATIVE
Comment: NEGATIVE
Comment: NEGATIVE
Comment: NORMAL
Diagnosis: NEGATIVE
High risk HPV: NEGATIVE
Neisseria Gonorrhea: NEGATIVE

## 2023-05-19 ENCOUNTER — Ambulatory Visit: Admitting: Adult Health

## 2023-07-27 ENCOUNTER — Encounter (HOSPITAL_COMMUNITY): Payer: Self-pay | Admitting: Emergency Medicine

## 2023-07-27 ENCOUNTER — Emergency Department (HOSPITAL_COMMUNITY)
Admission: EM | Admit: 2023-07-27 | Discharge: 2023-07-27 | Disposition: A | Discharge status: Home / Self Care | Attending: Emergency Medicine | Admitting: Emergency Medicine

## 2023-07-27 ENCOUNTER — Other Ambulatory Visit: Payer: Self-pay

## 2023-07-27 DIAGNOSIS — N938 Other specified abnormal uterine and vaginal bleeding: Secondary | ICD-10-CM | POA: Insufficient documentation

## 2023-07-27 LAB — CBC
HCT: 36.3 % (ref 36.0–46.0)
Hemoglobin: 12.6 g/dL (ref 12.0–15.0)
MCH: 29.2 pg (ref 26.0–34.0)
MCHC: 34.7 g/dL (ref 30.0–36.0)
MCV: 84.2 fL (ref 80.0–100.0)
Platelets: 371 K/uL (ref 150–400)
RBC: 4.31 MIL/uL (ref 3.87–5.11)
RDW: 12.8 % (ref 11.5–15.5)
WBC: 12 K/uL — ABNORMAL HIGH (ref 4.0–10.5)
nRBC: 0 % (ref 0.0–0.2)

## 2023-07-27 LAB — POC URINE PREG, ED: Preg Test, Ur: NEGATIVE

## 2023-07-27 MED ORDER — MEGESTROL ACETATE 20 MG PO TABS: ORAL_TABLET | ORAL | 0 refills | Status: DC

## 2023-07-27 MED ORDER — IBUPROFEN 400 MG PO TABS
600.0000 mg | ORAL_TABLET | Freq: Once | ORAL | Status: AC
Start: 2023-07-27 — End: 2023-07-27
  Administered 2023-07-27: 600 mg via ORAL
  Filled 2023-07-27: qty 2

## 2023-07-27 NOTE — Discharge Instructions (Signed)
 We are starting you on Megace  to help control the bleeding.  Follow-up with [facility].  Return to the ER if you develop new or worsening symptoms.

## 2023-07-27 NOTE — ED Triage Notes (Signed)
 Pt via POV c/o heavy vaginal bleeding x 1 week with large clots, noting that the flow is watery and she is having to change pads or tampons constantly. Pt notes that her periods are typically regular and predictable. Pt is also having lower abdominal cramping. Unsure of pregnancy status.

## 2023-07-27 NOTE — ED Provider Notes (Signed)
 [place] EMERGENCY DEPARTMENT AT [facility] Provider Note   CSN: [id number] Arrival date & time: 07/27/23  1918     Patient presents with: Vaginal Bleeding   Deanna Sheppard is a 33 y.o. female.   HPI 33 year old female presents with vaginal bleeding.  She has been having heavy and abnormal timing vaginal bleeding for the last 1 week.  Typically has her menstrual cycle the beginning of the month which she already had this month.  The bleeding is heavy with clots.  She is also having abdominal pain in the lower abdomen diffusely.  She is not sure if she is pregnant but has not taken a test.  No vaginal discharge or concern for STI.  No injuries.  Took ibuprofen  once a couple days ago, did not help.   Prior to Admission medications   Medication Sig Start Date End Date Taking? Authorizing Provider  megestrol  (MEGACE ) 20 MG tablet 40 mg TID until bleeding slows down, then 20 mg daily 07/27/23  Yes Freddi Hamilton, MD  hydrOXYzine  (VISTARIL ) 25 MG capsule Take 1 at bedtime 10/18/22   Signa Nest A, NP  naproxen  (NAPROSYN ) 500 MG tablet Take 500 mg by mouth 2 (two) times daily. 03/05/22   [provider]    Allergies: Bee venom, Azithromycin , and Levofloxacin     Review of Systems  Gastrointestinal:  Positive for abdominal pain.  Genitourinary:  Positive for vaginal bleeding. Negative for dysuria.    Updated Vital Signs BP (!) 127/97 (BP Location: Right Arm)   Pulse 88   Temp 98.4 F (36.9 C) (Oral)   Resp 18   Ht 5' 4 (1.626 m)   Wt 79.8 kg   LMP 07/17/2023 (Exact Date)   SpO2 97%   BMI 30.21 kg/m   Physical Exam Vitals and nursing note reviewed. Exam conducted with a chaperone present.  Constitutional:      Appearance: She is well-developed.  HENT:     Head: Normocephalic and atraumatic.  Pulmonary:     Effort: Pulmonary effort is normal.  Abdominal:     General: There is no distension.     Palpations: Abdomen is soft.     Tenderness:  There is no abdominal tenderness.  Genitourinary:    Comments: Scan bleeding from the cervix. Mild blood in the vaginal vault. No obvious lacerations/injuries. Skin:    General: Skin is warm and dry.  Neurological:     Mental Status: She is alert.     (all labs ordered are listed, but only abnormal results are displayed) Labs Reviewed  CBC - Abnormal; Notable for the following components:      Result Value   WBC 12.0 (*)    All other components within normal limits  POC URINE PREG, ED    EKG: None  Radiology: No results found.   Procedures   Medications Ordered in the ED  ibuprofen  (ADVIL ) tablet 600 mg (has no administration in time range)                                    Medical Decision Making Amount and/or Complexity of Data Reviewed Labs: ordered.  Risk Prescription drug management.   Patient's hemoglobin is normal.  Pregnancy test is negative.  Pelvic exam is overall unremarkable as well.  Has a benign abdominal exam.  I do not think ovarian torsion is likely.  I do not think an emergent  ultrasound will help or change management.  I think she needs to follow-up with her OB/GYN.  Will put her on some Megace  to see if this helps control the bleeding.  Otherwise, I have recommended she take NSAIDs to help with pain and bleeding.  Will have her follow-up with OB/GYN, will give return precautions.     Final diagnoses:  Dysfunctional uterine bleeding    ED Discharge Orders          Ordered    megestrol  (MEGACE ) 20 MG tablet        07/27/23 2016               Freddi Hamilton, MD 07/27/23 2023

## 2023-08-15 ENCOUNTER — Ambulatory Visit (INDEPENDENT_AMBULATORY_CARE_PROVIDER_SITE_OTHER): Admitting: Adult Health

## 2023-08-15 ENCOUNTER — Encounter: Payer: Self-pay | Admitting: Adult Health

## 2023-08-15 VITALS — BP 107/75 | HR 87 | Ht 64.0 in | Wt 176.0 lb

## 2023-08-15 DIAGNOSIS — Z3202 Encounter for pregnancy test, result negative: Secondary | ICD-10-CM

## 2023-08-15 DIAGNOSIS — N939 Abnormal uterine and vaginal bleeding, unspecified: Secondary | ICD-10-CM

## 2023-08-15 LAB — POCT URINE PREGNANCY: Preg Test, Ur: NEGATIVE

## 2023-08-15 MED ORDER — MEGESTROL ACETATE 40 MG/ML PO SUSP: ORAL | 0 refills | Status: AC

## 2023-08-15 NOTE — Progress Notes (Signed)
  Subjective:     Patient ID: Deanna Sheppard, female   DOB: March 28, 1990, 33 y.o.   MRN: [id number]  HPI Deanna Sheppard is a 33 year old black female,single, G1P1001, in complaining of bleeding since 07/23/23 and was seen in ER then was prescribed megace  but it did not help, but I don't think is was taking  3 ml, just 1 ml. Has been passing clots, no pain.     Component Value Date/Time   DIAGPAP  10/18/2022 1604    - Negative for intraepithelial lesion or malignancy (NILM)   DIAGPAP  09/25/2021 1301    - Negative for Intraepithelial Lesions or Malignancy (NILM)   DIAGPAP - Benign reactive/reparative changes 09/25/2021 1301   HPVHIGH Negative 10/18/2022 1604   HPVHIGH Positive (A) 09/25/2021 1301   HPVHIGH Positive (A) 04/09/2020 1130   ADEQPAP  10/18/2022 1604    Satisfactory for evaluation; transformation zone component ABSENT.   ADEQPAP  09/25/2021 1301    Satisfactory for evaluation; transformation zone component PRESENT.   ADEQPAP  04/09/2020 1130    Satisfactory for evaluation; transformation zone component PRESENT.    PCP is R Muse PA.  Review of Systems +bleeding since 07/23/23 and was seen in ER then was prescribed megace  but it did not help, but I don't think is was taking  3 ml, just 1 ml. Has been passing clots, no pain.   Reviewed past medical,surgical, social and family history. Reviewed medications and allergies.  Objective:   Physical Exam BP 107/75 (BP Location: Left Arm, Patient Position: Sitting, Cuff Size: Normal)   Pulse 87   Ht 5' 4 (1.626 m)   Wt 176 lb (79.8 kg)   LMP 07/23/2023 (Exact Date)   BMI 30.21 kg/m  UPT is negative  Skin warm and dry.Pelvic: external genitalia is normal in appearance no lesions, vagina:+ dark blood and small clots ,urethra has no lesions or masses noted, cervix:smooth and bulbous, uterus: normal size, shape and contour, non tender, no masses felt, adnexa: no masses or tenderness noted. Bladder is non tender and no masses felt.      Upstream - 08/15/23 1112       Pregnancy Intention Screening   Does the patient want to become pregnant in the next year? No    Does the patient's partner want to become pregnant in the next year? No    Would the patient like to discuss contraceptive options today? No      Contraception Wrap Up   Current Method Abstinence    End Method Abstinence         Examination chaperoned by Clarita young LPN Assessment:     1. Negative pregnancy test - POCT urine pregnancy  2. Abnormal uterine bleeding (AUB) (Primary) Will rx megace  and went over instructions with her Will get US  08/19/23 at 3:30 pm at [facility] to assess uterus  Meds ordered this encounter  Medications   megestrol  (MEGACE ) 40 MG/ML suspension    Sig: Take 3 ml daily for 5 days then 2 ml daily    Dispense:  120 mL    Refill:  0    Supervising Provider:   JAYNE MINDER H [[id number]]    - US  PELVIC COMPLETE WITH TRANSVAGINAL; Future     Plan:     Follow up TBD

## 2023-08-19 ENCOUNTER — Ambulatory Visit (HOSPITAL_COMMUNITY)
Admission: RE | Admit: 2023-08-19 | Discharge: 2023-08-19 | Disposition: A | Discharge status: Home / Self Care | Source: Ambulatory Visit | Attending: Adult Health | Admitting: Adult Health

## 2023-08-19 DIAGNOSIS — N939 Abnormal uterine and vaginal bleeding, unspecified: Secondary | ICD-10-CM | POA: Insufficient documentation

## 2023-09-06 ENCOUNTER — Ambulatory Visit: Payer: Self-pay | Admitting: Adult Health

## 2024-04-17 ENCOUNTER — Ambulatory Visit: Admitting: Adult Health
